# Patient Record
Sex: Female | Born: 1990 | Race: Black or African American | Hispanic: No | Marital: Single | Smoking: Never smoker
Health system: Southern US, Community
[De-identification: ages and names within clinical notes are randomized; demographics above are authoritative.]

## PROBLEM LIST (undated history)

## (undated) ENCOUNTER — Inpatient Hospital Stay (HOSPITAL_COMMUNITY): Payer: Self-pay

## (undated) DIAGNOSIS — F419 Anxiety disorder, unspecified: Secondary | ICD-10-CM

## (undated) DIAGNOSIS — F32A Depression, unspecified: Secondary | ICD-10-CM

## (undated) DIAGNOSIS — F329 Major depressive disorder, single episode, unspecified: Secondary | ICD-10-CM

## (undated) HISTORY — DX: Depression, unspecified: F32.A

## (undated) HISTORY — DX: Anxiety disorder, unspecified: F41.9

## (undated) HISTORY — DX: Major depressive disorder, single episode, unspecified: F32.9

---

## 2012-04-15 ENCOUNTER — Encounter (HOSPITAL_COMMUNITY): Payer: Self-pay | Admitting: Emergency Medicine

## 2012-04-15 ENCOUNTER — Emergency Department (INDEPENDENT_AMBULATORY_CARE_PROVIDER_SITE_OTHER)
Admission: EM | Admit: 2012-04-15 | Discharge: 2012-04-15 | Disposition: A | Discharge status: Home / Self Care | Payer: BLUE CROSS/BLUE SHIELD | Source: Home / Self Care | Attending: Emergency Medicine | Admitting: Emergency Medicine

## 2012-04-15 DIAGNOSIS — L0231 Cutaneous abscess of buttock: Secondary | ICD-10-CM

## 2012-04-15 DIAGNOSIS — R52 Pain, unspecified: Secondary | ICD-10-CM

## 2012-04-15 MED ORDER — IBUPROFEN 600 MG PO TABS: 600.0000 mg | ORAL_TABLET | Freq: Four times a day (QID) | ORAL | Status: DC | PRN

## 2012-04-15 MED ORDER — SULFAMETHOXAZOLE-TRIMETHOPRIM 800-160 MG PO TABS: 1.0000 | ORAL_TABLET | Freq: Two times a day (BID) | ORAL | Status: DC

## 2012-04-15 MED ORDER — HYDROCODONE-ACETAMINOPHEN 5-500 MG PO TABS: 1.0000 | ORAL_TABLET | Freq: Four times a day (QID) | ORAL | Status: DC | PRN

## 2012-04-15 MED ORDER — HYDROCODONE-ACETAMINOPHEN 5-325 MG PO TABS
1.0000 | ORAL_TABLET | Freq: Once | ORAL | Status: AC
  Administered 2012-04-15: 1 via ORAL

## 2012-04-15 MED ORDER — HYDROCODONE-ACETAMINOPHEN 5-325 MG PO TABS
ORAL_TABLET | ORAL | Status: AC
  Filled 2012-04-15: qty 1

## 2012-04-15 MED ORDER — IBUPROFEN 800 MG PO TABS
ORAL_TABLET | ORAL | Status: AC
  Filled 2012-04-15: qty 1

## 2012-04-15 MED ORDER — IBUPROFEN 800 MG PO TABS
800.0000 mg | ORAL_TABLET | Freq: Once | ORAL | Status: AC
  Administered 2012-04-15: 800 mg via ORAL

## 2012-04-15 NOTE — ED Provider Notes (Signed)
History     CSN: [id number]  Arrival date & time 04/15/12  1405   First MD Initiated Contact with Patient 04/15/12 1408      Chief Complaint  Patient presents with  . Insect Bite    (Consider location/radiation/quality/duration/timing/severity/associated sxs/prior treatment) The history is provided by the patient.  This patient presents for evaluation of a cutaneous abscess.  The lesion is located on the right buttocks.  Onset was 1 week ago.  Symptoms have worsening.  Abscess has associated symptoms of tenderness.  The patient does not have previous history of cutaneous abscesses.  There is no known previous history of MRSA.   They do not have a history of diabetes.  History reviewed. No pertinent past medical history.  History reviewed. No pertinent past surgical history.  History reviewed. No pertinent family history.  History  Substance Use Topics  . Smoking status: Never Smoker   . Smokeless tobacco: Not on file  . Alcohol Use: No    OB History    Grav Para Term Preterm Abortions TAB SAB Ect Mult Living                  Review of Systems  Constitutional: Negative.   Respiratory: Negative.   Cardiovascular: Negative.   Musculoskeletal: Negative.   Skin: Positive for wound.    Allergies  Review of patient's allergies indicates no known allergies.  Home Medications   Current Outpatient Rx  Name Route Sig Dispense Refill  . HYDROCODONE-ACETAMINOPHEN 5-500 MG PO TABS Oral Take 1 tablet by mouth every 6 (six) hours as needed for pain. 8 tablet 0  . IBUPROFEN 600 MG PO TABS Oral Take 1 tablet (600 mg total) by mouth every 6 (six) hours as needed for pain. 30 tablet 0  . SULFAMETHOXAZOLE-TRIMETHOPRIM 800-160 MG PO TABS Oral Take 1 tablet by mouth every 12 (twelve) hours. 20 tablet 0    BP 110/59  Pulse 85  Temp 98.3 F (36.8 C) (Oral)  Resp 16  SpO2 100%  LMP 03/25/2012  Physical Exam  Nursing note and vitals reviewed. Constitutional: She is  oriented to person, place, and time. Vital signs are normal. She appears well-developed and well-nourished. She is active and cooperative.  HENT:  Head: Normocephalic.  Eyes: Conjunctivae normal are normal. Pupils are equal, round, and reactive to light. No scleral icterus.  Neck: Trachea normal. Neck supple.  Cardiovascular: Normal rate and regular rhythm.   Pulmonary/Chest: Effort normal and breath sounds normal.  Musculoskeletal: Normal range of motion.  Neurological: She is alert and oriented to person, place, and time. She has normal strength. No cranial nerve deficit or sensory deficit. GCS eye subscore is 4. GCS verbal subscore is 5. GCS motor subscore is 6.  Skin: Skin is warm, dry and intact.     Psychiatric: She has a normal mood and affect. Her speech is normal and behavior is normal. Judgment and thought content normal. Cognition and memory are normal.    ED Course  INCISION AND DRAINAGE Date/Time: 04/15/2012 4:04 PM Performed by: Nigel Mormon Tionna Gigante L Authorized by: Johnsie Kindred Consent: Verbal consent obtained. Risks and benefits: risks, benefits and alternatives were discussed Consent given by: patient Patient understanding: patient states understanding of the procedure being performed Patient consent: the patient's understanding of the procedure matches consent given Procedure consent: procedure consent matches procedure scheduled Site marked: the operative site was marked Patient identity confirmed: verbally with patient and arm band Time out: Immediately prior to procedure a "time  out" was called to verify the correct patient, procedure, equipment, support staff and site/side marked as required. Type: abscess Body area: anogenital Location details: gluteal cleft Anesthesia: local infiltration Local anesthetic: lidocaine 2% with epinephrine Anesthetic total: 4 ml Patient sedated: no Scalpel size: 11 Needle gauge: 27. Incision type: crosscut. Complexity:  simple Drainage: purulent, serosanguinous and serous Drainage amount: moderate Wound treatment: wound left open Packing material: 1/2 in iodoform gauze Patient tolerance: Patient tolerated the procedure well with no immediate complications.   (including critical care time)   Labs Reviewed  CULTURE, ROUTINE-ABSCESS   No results found.   1. Abscess of gluteal cleft   2. Pain       MDM  Medications as prescribed, rtc in 4 days for wound recheck.        Johnsie Kindred, NP 04/16/12 1212

## 2012-04-15 NOTE — ED Notes (Signed)
?   Spider bite to right buttock. Noticed 4 days ago, pt states that its is a black and redness around bite.   Pt states it started out as a small bump but is gradually getting bigger and is painful.

## 2012-04-18 LAB — CULTURE, ROUTINE-ABSCESS

## 2012-04-18 NOTE — ED Notes (Signed)
Abscess culture R buttocks:  Mod. Staph. Aureus.  Pt. adequately treated with Septra DS. Vassie Moselle 04/18/2012

## 2012-04-21 NOTE — ED Provider Notes (Signed)
Medical screening examination/treatment/procedure(s) were performed by non-physician practitioner and as supervising physician I was immediately available for consultation/collaboration.  Pauline Pegues, M.D.   Felipe Cabell C Derian Pfost, MD 04/21/12 1254 

## 2015-04-25 ENCOUNTER — Emergency Department (HOSPITAL_COMMUNITY)
Admission: EM | Admit: 2015-04-25 | Discharge: 2015-04-25 | Discharge status: Against Medical Advice | Payer: BLUE CROSS/BLUE SHIELD
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
## 2015-04-25 NOTE — ED Notes (Signed)
Pt name called x 3 for triage with no response

## 2015-06-29 HISTORY — PX: DILATION AND CURETTAGE OF UTERUS: SHX78

## 2017-01-05 LAB — OB RESULTS CONSOLE HGB/HCT, BLOOD
HCT: 41
Hemoglobin: 13.8

## 2017-01-05 LAB — OB RESULTS CONSOLE HEPATITIS B SURFACE ANTIGEN: HEP B S AG: NEGATIVE

## 2017-01-05 LAB — OB RESULTS CONSOLE PLATELET COUNT: Platelets: 257

## 2017-01-05 LAB — OB RESULTS CONSOLE ABO/RH
RH TYPE: NEGATIVE
RH TYPE: POSITIVE

## 2017-01-05 LAB — HIV ANTIBODY (ROUTINE TESTING W REFLEX): HIV SCREEN 4TH GENERATION: NEGATIVE

## 2017-01-05 LAB — OB RESULTS CONSOLE ANTIBODY SCREEN: ANTIBODY SCREEN: NEGATIVE

## 2017-01-05 LAB — OB RESULTS CONSOLE RPR: RPR: NONREACTIVE

## 2017-01-05 LAB — OB RESULTS CONSOLE RUBELLA ANTIBODY, IGM: RUBELLA: IMMUNE

## 2017-02-07 ENCOUNTER — Other Ambulatory Visit: Payer: Self-pay | Admitting: Obstetrics and Gynecology

## 2017-02-07 ENCOUNTER — Other Ambulatory Visit (HOSPITAL_COMMUNITY)
Admission: RE | Admit: 2017-02-07 | Discharge: 2017-02-07 | Disposition: A | Discharge status: Home / Self Care | Payer: BLUE CROSS/BLUE SHIELD | Source: Ambulatory Visit | Attending: Obstetrics and Gynecology | Admitting: Obstetrics and Gynecology

## 2017-02-07 DIAGNOSIS — Z124 Encounter for screening for malignant neoplasm of cervix: Secondary | ICD-10-CM | POA: Diagnosis present

## 2017-02-07 LAB — OB RESULTS CONSOLE GC/CHLAMYDIA
Chlamydia: NEGATIVE
Gonorrhea: NEGATIVE

## 2017-02-07 LAB — CYTOLOGY - PAP: PAP SMEAR: NEGATIVE

## 2017-02-09 LAB — CYTOLOGY - PAP
CHLAMYDIA, DNA PROBE: NEGATIVE
Diagnosis: NEGATIVE
NEISSERIA GONORRHEA: NEGATIVE

## 2017-04-08 ENCOUNTER — Encounter: Payer: Self-pay | Admitting: *Deleted

## 2017-04-11 ENCOUNTER — Encounter: Payer: BLUE CROSS/BLUE SHIELD | Admitting: Advanced Practice Midwife

## 2017-05-04 ENCOUNTER — Encounter: Payer: Self-pay | Admitting: Obstetrics and Gynecology

## 2017-05-04 ENCOUNTER — Ambulatory Visit (INDEPENDENT_AMBULATORY_CARE_PROVIDER_SITE_OTHER): Payer: BLUE CROSS/BLUE SHIELD | Admitting: Obstetrics and Gynecology

## 2017-05-04 VITALS — BP 130/62 | HR 109 | Ht 61.0 in | Wt 164.3 lb

## 2017-05-04 DIAGNOSIS — Z23 Encounter for immunization: Secondary | ICD-10-CM

## 2017-05-04 DIAGNOSIS — Z3490 Encounter for supervision of normal pregnancy, unspecified, unspecified trimester: Secondary | ICD-10-CM

## 2017-05-04 DIAGNOSIS — Z3480 Encounter for supervision of other normal pregnancy, unspecified trimester: Secondary | ICD-10-CM | POA: Insufficient documentation

## 2017-05-04 DIAGNOSIS — Z3492 Encounter for supervision of normal pregnancy, unspecified, second trimester: Secondary | ICD-10-CM

## 2017-05-04 NOTE — Progress Notes (Signed)
New ob packet given  Flu vaccine given  Anatomy US scheduled for November 12th @ 1415.  Pt notified.   Panorama shipped via [employer].  Confirmation # [id number]

## 2017-05-04 NOTE — Progress Notes (Signed)
   PRENATAL VISIT NOTE - TRANSFER from [facility]  Subjective:  Karla Davies is a 26 y.o. G2P0010 at 1438w3d being seen today for ongoing prenatal care. [facility]. She is currently monitored for the following issues for this low-risk pregnancy and does not have a problem list on file.  Patient reports active fetal movement and slight increase in usual discharge, denies itching/burning.  Contractions: Not present. Vag. Bleeding: None.  Movement: Present. Denies leaking of fluid.   The following portions of the patient's history were reviewed and updated as appropriate: allergies, current medications, past family history, past medical history, past social history, past surgical history and problem list. Problem list updated.  No current outpatient medications on file.   Objective:   Vitals:   05/04/17 0825 05/04/17 0827  BP: 130/62   Pulse: (!) 109   Weight: 164 lb 4.8 oz (74.5 kg)   Height:  5\' 1"  (1.549 m)    Fetal Status: Fetal Heart Rate (bpm): 151   Movement: Present     General:  Alert, oriented and cooperative. Patient is in no acute distress.  Skin: Skin is warm and dry. No rash noted.   Cardiovascular: Normal heart rate noted  Respiratory: Normal respiratory effort, no problems with respiration noted  Abdomen: Soft, gravid, appropriate for gestational age.  Pain/Pressure: Present     Pelvic: Cervical exam deferred        Extremities: Normal range of motion.  Edema: Trace  Mental Status:  Normal mood and affect. Normal behavior. Normal judgment and thought content.   Assessment and Plan:  Pregnancy: G2P0010 at 8738w3d  1. Encounter for supervision of low-risk pregnancy, antepartum Flu shot today Patient desires NIPS - Genetic Screening - Cystic Fibrosis Mutation 97 - SMN1 COPY NUMBER ANALYSIS (SMA Carrier Screen) - US MFM OB COMP + 14 WK; Future  Preterm labor symptoms and general obstetric precautions including but not limited to  vaginal bleeding, contractions, leaking of fluid and fetal movement were reviewed in detail with the patient. Please refer to After Visit Summary for other counseling recommendations.  Return in about 4 weeks (around 06/01/2017) for 2 hr GTT, OB visit.   Conan BowensKelly M Davis, MD

## 2017-05-09 ENCOUNTER — Ambulatory Visit (HOSPITAL_COMMUNITY)
Admission: RE | Admit: 2017-05-09 | Discharge: 2017-05-09 | Disposition: A | Discharge status: Home / Self Care | Payer: BLUE CROSS/BLUE SHIELD | Source: Ambulatory Visit | Attending: Obstetrics and Gynecology | Admitting: Obstetrics and Gynecology

## 2017-05-09 DIAGNOSIS — Z3490 Encounter for supervision of normal pregnancy, unspecified, unspecified trimester: Secondary | ICD-10-CM | POA: Insufficient documentation

## 2017-05-09 DIAGNOSIS — Z3A24 24 weeks gestation of pregnancy: Secondary | ICD-10-CM | POA: Diagnosis not present

## 2017-05-09 IMAGING — US US MFM OB COMP +14 WKS
1 series · 14 of 28 positions shown · non-contrast
Comparison: none

[Series 1: us mfm ob comp +14 wks · 129 acquisitions, 14 frames shown]
[im 5/129]
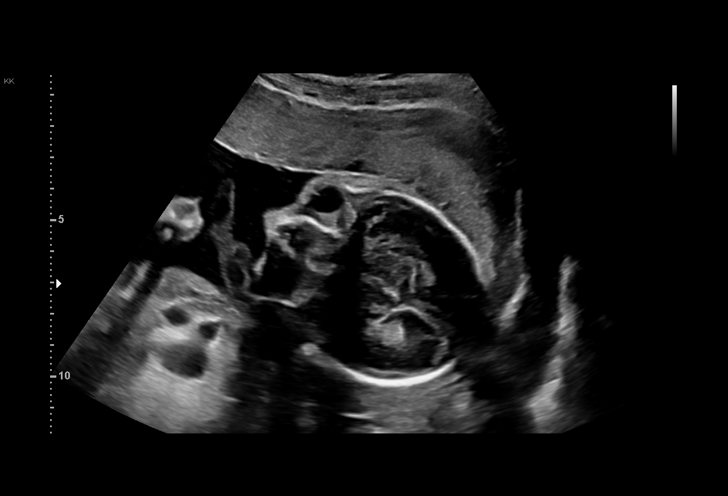
[im 15/129]
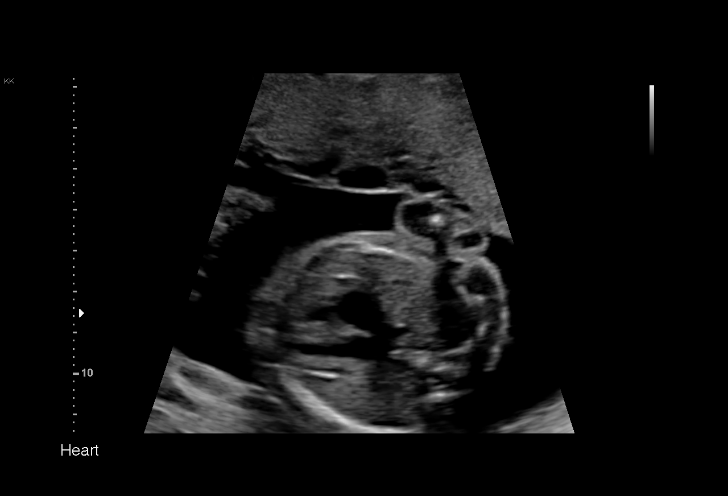
[im 24/129]
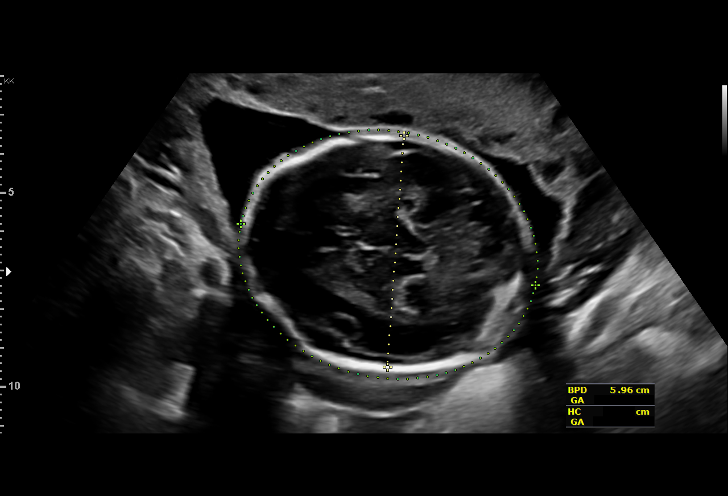
[im 34/129]
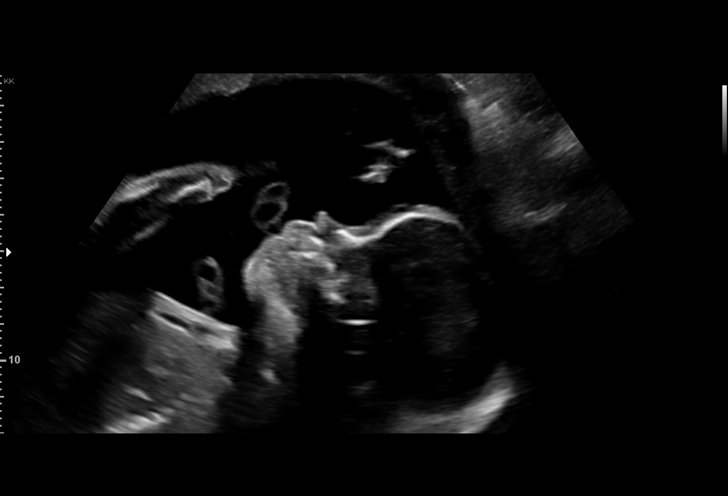
[im 43/129]
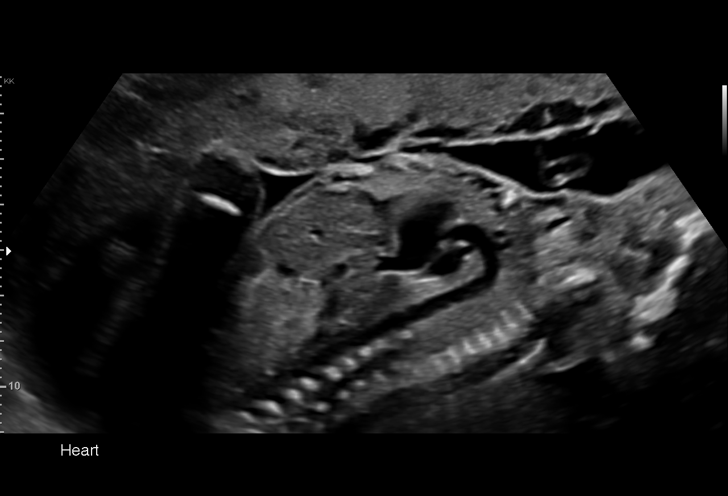
[im 53/129]
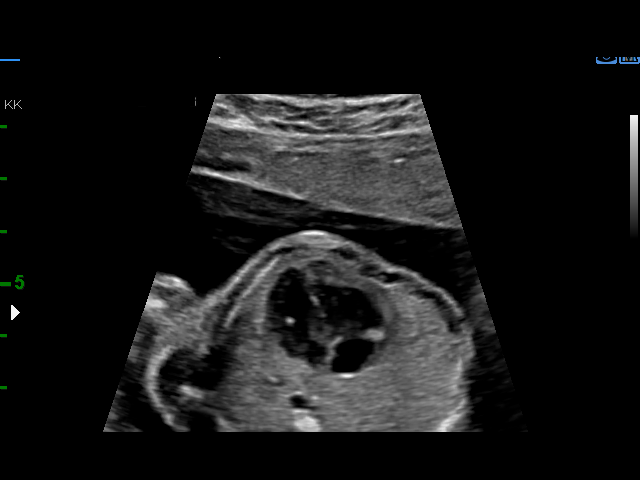
[im 62/129]
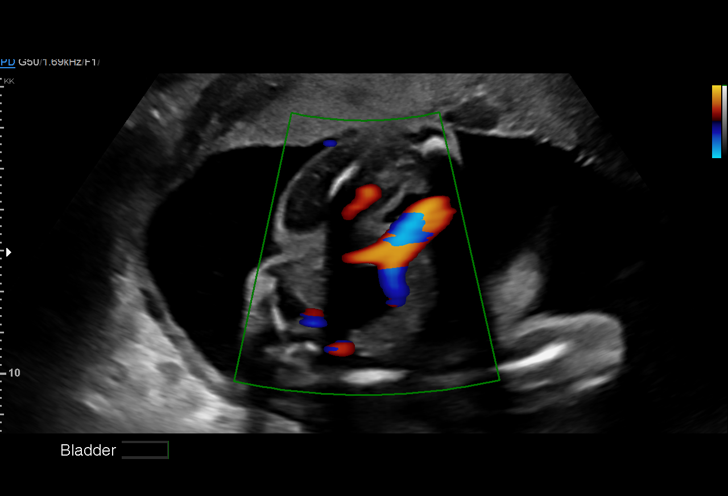
[im 72/129]
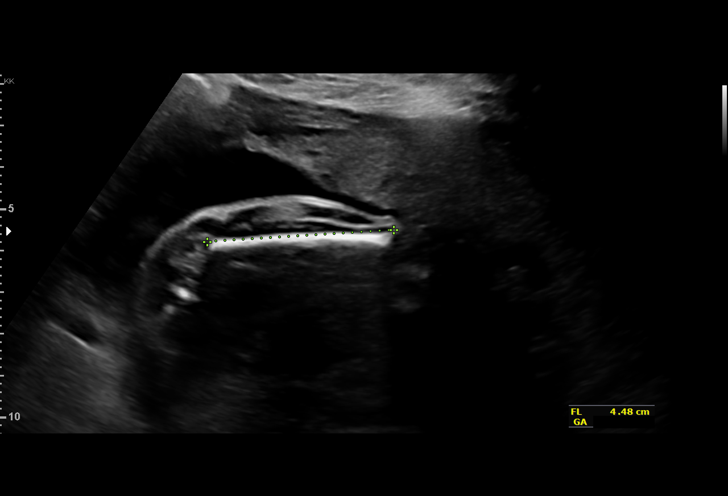
[im 81/129]
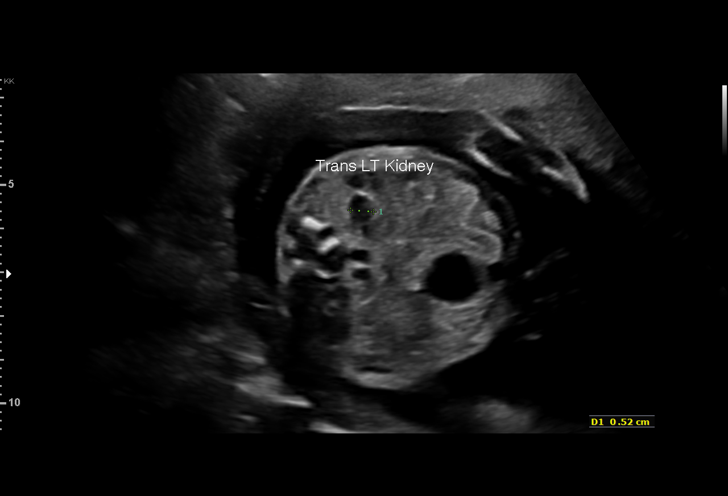
[im 91/129]
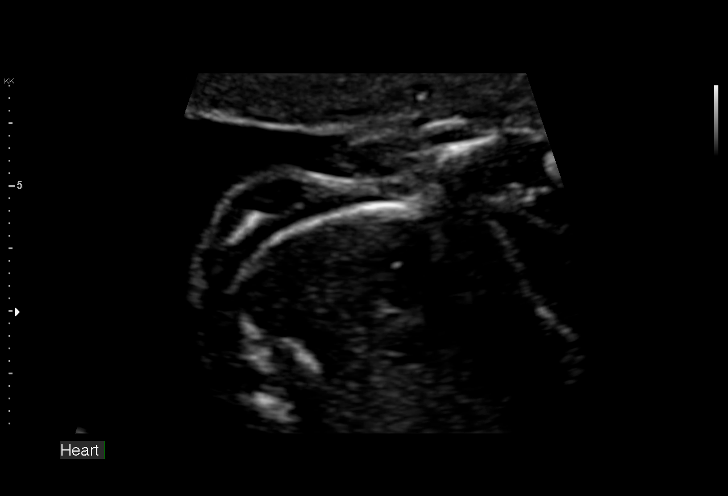
[im 100/129]
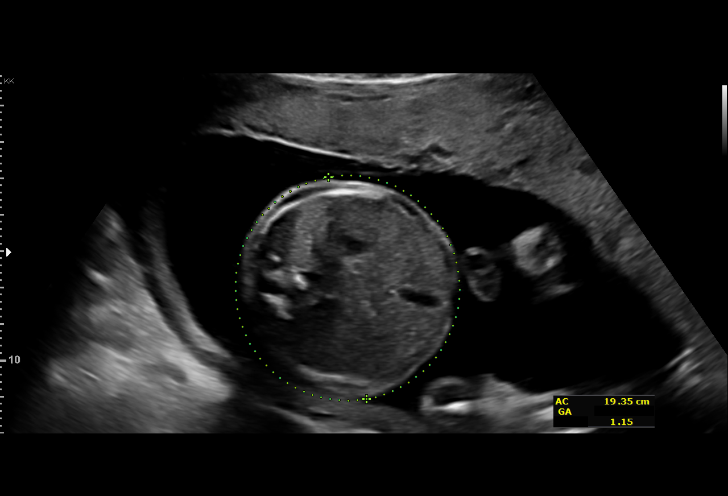
[im 110/129]
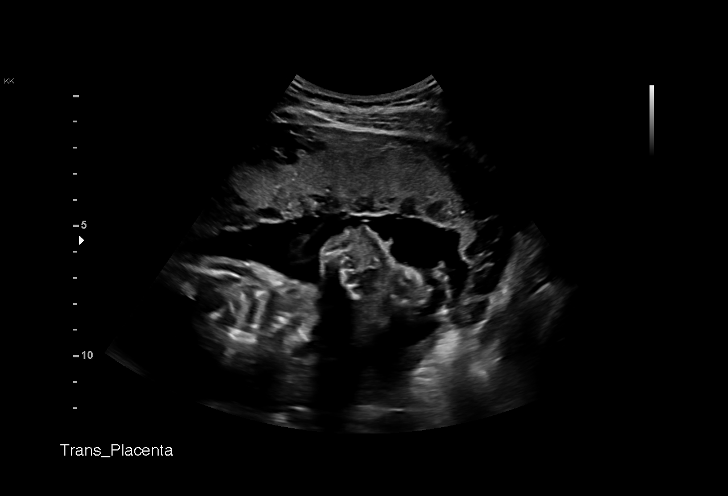
[im 119/129]
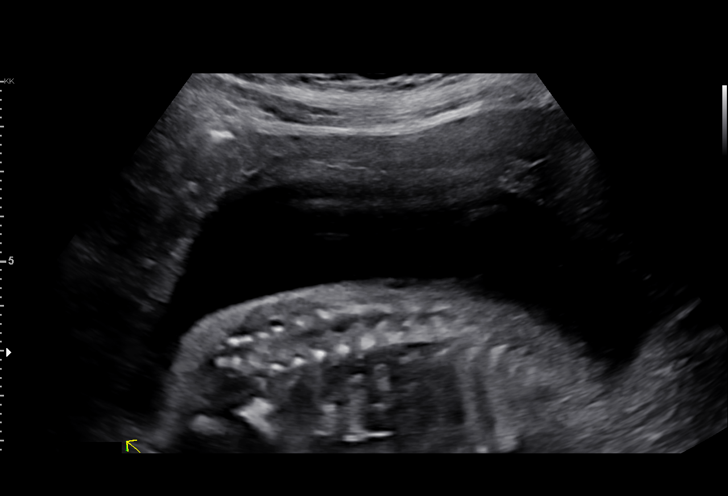
[im 129/129]
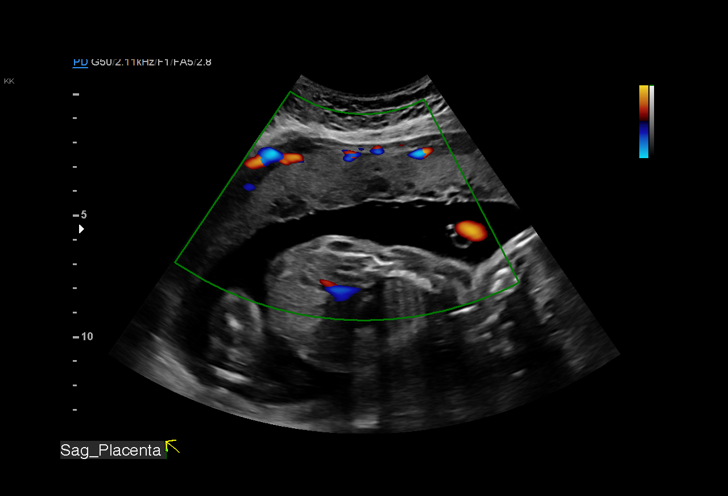

[14 of 28 positions shown; findings below may reference images not displayed]

1  FRISCH              [PHONE_NUMBER]       [PHONE_NUMBER]     [PHONE_NUMBER]
Indications

24 weeks gestation of pregnancy
Encounter for antenatal screening for          [5Z]
malformations
OB History

Blood Type:            Height:  5'1"   Weight (lb):  164       BMI:
Gravidity:    2
TOP:          1        Living:  0
Fetal Evaluation

Num Of Fetuses:     1
Fetal Heart         143
Rate(bpm):
Cardiac Activity:   Observed
Presentation:       Cephalic
Placenta:           Anterior, above cervical os
P. Cord Insertion:  Visualized

Amniotic Fluid
AFI FV:      Subjectively within normal limits

Largest Pocket(cm)
5.6
Biometry

BPD:      59.6  mm     G. Age:  24w 2d         50  %    CI:         72.8   %    70 - 86
FL/HC:      19.8   %    18.7 -
HC:      222.1  mm     G. Age:  24w 1d         36  %    HC/AC:      1.15        1.05 -
AC:      192.9  mm     G. Age:  24w 0d         37  %    FL/BPD:     73.8   %    71 - 87
FL:         44  mm     G. Age:  24w 4d         48  %    FL/AC:      22.8   %    20 - 24

Est. FW:     673  gm      1 lb 8 oz     53  %
Gestational Age

LMP:           22w 6d        Date:  [DATE]                 EDD:   [DATE]
U/S Today:     24w 2d                                        EDD:   [DATE]
Best:          24w 1d     Det. By:  Previous Ultrasound      EDD:   [DATE]
([DATE])
Anatomy

Cranium:               Appears normal         Aortic Arch:            Appears normal
Cavum:                 Appears normal         Ductal Arch:            Appears normal
Ventricles:            Appears normal         Diaphragm:              Appears normal
Choroid Plexus:        Appears normal         Stomach:                Appears normal, left
sided
Cerebellum:            Appears normal         Abdomen:                Appears normal
Posterior Fossa:       Appears normal         Abdominal Wall:         Appears nml (cord
insert, abd wall)
Nuchal Fold:           Not applicable (>20    Cord Vessels:           Appears normal (3
wks GA)                                        vessel cord)
Face:                  Appears normal         Kidneys:                Appear normal
(orbits and profile)
Lips:                  Appears normal         Bladder:                Appears normal
Thoracic:              Appears normal         Spine:                  Appears normal
Heart:                 Appears normal         Upper Extremities:      Appears normal
(4CH, axis, and
situs)
RVOT:                  Appears normal         Lower Extremities:      Appears normal
LVOT:                  Appears normal

Other:  Male gender. Heels and 5th digit visualized. Technically difficult due
to fetal position.
Cervix Uterus Adnexa

Cervix
Length:            3.1  cm.
Normal appearance by transabdominal scan.
Impression

SIUP at 24+1 weeks
Normal detailed fetal anatomy
Normal amniotic fluid volume
Measurements consistent with prior US
Recommendations

Follow-up as clinically indicated

## 2017-05-13 LAB — SMN1 COPY NUMBER ANALYSIS (SMA CARRIER SCREENING)

## 2017-05-13 LAB — CYSTIC FIBROSIS MUTATION 97: GENE DIS ANAL CARRIER INTERP BLD/T-IMP: NOT DETECTED

## 2017-05-26 ENCOUNTER — Encounter (HOSPITAL_COMMUNITY): Payer: Self-pay | Admitting: *Deleted

## 2017-05-26 ENCOUNTER — Inpatient Hospital Stay (HOSPITAL_COMMUNITY)
Admission: AD | Admit: 2017-05-26 | Discharge: 2017-05-26 | Disposition: A | Discharge status: Home / Self Care | Payer: BLUE CROSS/BLUE SHIELD | Source: Ambulatory Visit | Attending: Obstetrics & Gynecology | Admitting: Obstetrics & Gynecology

## 2017-05-26 DIAGNOSIS — O26892 Other specified pregnancy related conditions, second trimester: Secondary | ICD-10-CM | POA: Insufficient documentation

## 2017-05-26 DIAGNOSIS — Z3480 Encounter for supervision of other normal pregnancy, unspecified trimester: Secondary | ICD-10-CM

## 2017-05-26 DIAGNOSIS — N949 Unspecified condition associated with female genital organs and menstrual cycle: Secondary | ICD-10-CM

## 2017-05-26 DIAGNOSIS — Z3A26 26 weeks gestation of pregnancy: Secondary | ICD-10-CM

## 2017-05-26 DIAGNOSIS — O26899 Other specified pregnancy related conditions, unspecified trimester: Secondary | ICD-10-CM

## 2017-05-26 DIAGNOSIS — O9989 Other specified diseases and conditions complicating pregnancy, childbirth and the puerperium: Secondary | ICD-10-CM | POA: Diagnosis not present

## 2017-05-26 DIAGNOSIS — N858 Other specified noninflammatory disorders of uterus: Secondary | ICD-10-CM

## 2017-05-26 DIAGNOSIS — R109 Unspecified abdominal pain: Secondary | ICD-10-CM

## 2017-05-26 DIAGNOSIS — R102 Pelvic and perineal pain: Secondary | ICD-10-CM | POA: Insufficient documentation

## 2017-05-26 DIAGNOSIS — N859 Noninflammatory disorder of uterus, unspecified: Secondary | ICD-10-CM | POA: Diagnosis not present

## 2017-05-26 DIAGNOSIS — O4692 Antepartum hemorrhage, unspecified, second trimester: Secondary | ICD-10-CM | POA: Insufficient documentation

## 2017-05-26 LAB — CBC
HEMATOCRIT: 34.9 % — AB (ref 36.0–46.0)
HEMOGLOBIN: 11.7 g/dL — AB (ref 12.0–15.0)
MCH: 29.3 pg (ref 26.0–34.0)
MCHC: 33.5 g/dL (ref 30.0–36.0)
MCV: 87.5 fL (ref 78.0–100.0)
Platelets: 235 10*3/uL (ref 150–400)
RBC: 3.99 MIL/uL (ref 3.87–5.11)
RDW: 13.6 % (ref 11.5–15.5)
WBC: 15.3 10*3/uL — AB (ref 4.0–10.5)

## 2017-05-26 LAB — URINALYSIS, ROUTINE W REFLEX MICROSCOPIC
BILIRUBIN URINE: NEGATIVE
GLUCOSE, UA: NEGATIVE mg/dL
HGB URINE DIPSTICK: NEGATIVE
KETONES UR: NEGATIVE mg/dL
Leukocytes, UA: NEGATIVE
NITRITE: NEGATIVE
PH: 6 (ref 5.0–8.0)
Protein, ur: NEGATIVE mg/dL
SPECIFIC GRAVITY, URINE: 1.017 (ref 1.005–1.030)

## 2017-05-26 LAB — WET PREP, GENITAL
SPERM: NONE SEEN
Trich, Wet Prep: NONE SEEN
Yeast Wet Prep HPF POC: NONE SEEN

## 2017-05-26 MED ORDER — LACTATED RINGERS IV BOLUS (SEPSIS): 1000.0000 mL | Freq: Once | INTRAVENOUS | Status: DC

## 2017-05-26 NOTE — Discharge Instructions (Signed)

## 2017-05-26 NOTE — MAU Note (Signed)
PT  SAYS SHE HAD VAG BLEEDING  AT 5 PM - IN HER UNDERWEAR- NO MORE SINCE THEN  BUT  HAS D[facility] .  [facility]  LAST SEX-  1 WEEK   AGO.     FEELS CRAMPS - STARTED AT  5 PM- WORSE  NOW.

## 2017-05-26 NOTE — MAU Provider Note (Signed)
History     CSN: [id number]  Arrival date and time: 05/26/17 2051   First Provider Initiated Contact with Patient 05/26/17 2201      Chief Complaint  Patient presents with  . Pelvic Pain   Pelvic Pain  The patient's primary symptoms include pelvic pain and vaginal bleeding. This is a new problem. The current episode started today. The problem occurs constantly. The problem has been resolved. Pain severity now: 0/10 at this time.  The problem affects both sides. She is pregnant. The vaginal bleeding is spotting (bright red spotting x1, but none since. ). She has not been passing clots. She has not been passing tissue. Nothing aggravates the symptoms. She has tried nothing for the symptoms. Sexual activity: No intercourse in the last 24 hours.    Past Medical History:  Diagnosis Date  . Anxiety   . Depression     Past Surgical History:  Procedure Laterality Date  . DILATION AND CURETTAGE OF UTERUS  2017    Family History  Problem Relation Age of Onset  . Heart disease Father     Social History   Tobacco Use  . Smoking status: Never Smoker  . Smokeless tobacco: Never Used  Substance Use Topics  . Alcohol use: No  . Drug use: No    Allergies: No Known Allergies  No medications prior to admission.    Review of Systems  Genitourinary: Positive for pelvic pain.   Physical Exam   Blood pressure 118/77, pulse (!) 123, temperature 98.3 F (36.8 C), temperature source Oral, resp. rate 20, height 5\' 1"  (1.549 m), weight 166 lb 4 oz (75.4 kg), last menstrual period 11/30/2016.  Physical Exam  Nursing note and vitals reviewed. Constitutional: She is oriented to person, place, and time. She appears well-developed. No distress.  HENT:  Head: Normocephalic.  Cardiovascular: Normal rate.  Respiratory: Effort normal.  GI: Soft. There is no tenderness. There is no rebound.  Genitourinary:  Genitourinary Comments:  External: no lesion Vagina: small amount of white  discharge, no blood seen  Cervix: pink, smooth, closed/thick  Uterus: AGA  Neurological: She is alert and oriented to person, place, and time.  Skin: Skin is warm and dry.  Psychiatric: She has a normal mood and affect.  FHT: 145, moderate with 15x15 accels, no decels Toco: some UI  Results for orders placed or performed during the hospital encounter of 05/26/17 (from the past 24 hour(s))  Urinalysis, Routine w reflex microscopic     Status: Abnormal   Collection Time: 05/26/17  9:08 PM  Result Value Ref Range   Color, Urine YELLOW YELLOW   APPearance HAZY (A) CLEAR   Specific Gravity, Urine 1.017 1.005 - 1.030   pH 6.0 5.0 - 8.0   Glucose, UA NEGATIVE NEGATIVE mg/dL   Hgb urine dipstick NEGATIVE NEGATIVE   Bilirubin Urine NEGATIVE NEGATIVE   Ketones, ur NEGATIVE NEGATIVE mg/dL   Protein, ur NEGATIVE NEGATIVE mg/dL   Nitrite NEGATIVE NEGATIVE   Leukocytes, UA NEGATIVE NEGATIVE  CBC     Status: Abnormal   Collection Time: 05/26/17 10:14 PM  Result Value Ref Range   WBC 15.3 (H) 4.0 - 10.5 K/uL   RBC 3.99 3.87 - 5.11 MIL/uL   Hemoglobin 11.7 (L) 12.0 - 15.0 g/dL   HCT 40.934.9 (L) 81.136.0 - 91.446.0 %   MCV 87.5 78.0 - 100.0 fL   MCH 29.3 26.0 - 34.0 pg   MCHC 33.5 30.0 - 36.0 g/dL   RDW 78.213.6 95.611.5 -  15.5 %   Platelets 235 150 - 400 K/uL  Wet prep, genital     Status: Abnormal   Collection Time: 05/26/17 11:17 PM  Result Value Ref Range   Yeast Wet Prep HPF POC NONE SEEN NONE SEEN   Trich, Wet Prep NONE SEEN NONE SEEN   Clue Cells Wet Prep HPF POC PRESENT (A) NONE SEEN   WBC, Wet Prep HPF POC MODERATE (A) NONE SEEN   Sperm NONE SEEN     MAU Course  Procedures  MDM Spoke with lab. Sam states that the records from [facility] show patient is Rh +, and there is a faxed record from the A[facility]/RH done on 7/11 and it was also Rh +. He states that the Rh negative status was entered incorrectly into epic.   LR bolus complete. UI has stopped.   Assessment and Plan   1. Uterine irritability    2. Supervision of other normal pregnancy, antepartum   3. [redacted] weeks gestation of pregnancy   4. Pelvic pain in pregnancy   5. Round ligament pain    DC home Comfort measures reviewed  3rd Trimester precautions  PTL precautions  Fetal kick counts RX: none  Return to MAU as needed FU with OB as planned  Follow-up Information    [facility]   Specialty:  Obstetrics and Gynecology Contact information: [address] [place] [id number] [phone]           Karla Davies 05/26/2017, 11:29 PM

## 2017-05-27 LAB — GC/CHLAMYDIA PROBE AMP (~~LOC~~) NOT AT ARMC
Chlamydia: NEGATIVE
Neisseria Gonorrhea: NEGATIVE

## 2017-05-30 ENCOUNTER — Encounter: Payer: Self-pay | Admitting: General Practice

## 2017-05-30 ENCOUNTER — Ambulatory Visit (INDEPENDENT_AMBULATORY_CARE_PROVIDER_SITE_OTHER): Payer: BLUE CROSS/BLUE SHIELD | Admitting: Advanced Practice Midwife

## 2017-05-30 ENCOUNTER — Ambulatory Visit: Payer: Self-pay

## 2017-05-30 ENCOUNTER — Ambulatory Visit (INDEPENDENT_AMBULATORY_CARE_PROVIDER_SITE_OTHER): Payer: BLUE CROSS/BLUE SHIELD | Admitting: Clinical

## 2017-05-30 VITALS — BP 112/67 | HR 101 | Wt 164.3 lb

## 2017-05-30 DIAGNOSIS — Z3480 Encounter for supervision of other normal pregnancy, unspecified trimester: Secondary | ICD-10-CM

## 2017-05-30 DIAGNOSIS — F4323 Adjustment disorder with mixed anxiety and depressed mood: Secondary | ICD-10-CM

## 2017-05-30 DIAGNOSIS — L309 Dermatitis, unspecified: Secondary | ICD-10-CM | POA: Insufficient documentation

## 2017-05-30 DIAGNOSIS — Z23 Encounter for immunization: Secondary | ICD-10-CM | POA: Diagnosis not present

## 2017-05-30 DIAGNOSIS — Z3482 Encounter for supervision of other normal pregnancy, second trimester: Secondary | ICD-10-CM

## 2017-05-30 MED ORDER — TETANUS-DIPHTH-ACELL PERTUSSIS 5-2.5-18.5 LF-MCG/0.5 IM SUSP
0.5000 mL | Freq: Once | INTRAMUSCULAR | Status: AC
  Administered 2017-05-30: 0.5 mL via INTRAMUSCULAR

## 2017-05-30 MED ORDER — HYDROCORTISONE 2.5 % EX CREA: TOPICAL_CREAM | Freq: Two times a day (BID) | CUTANEOUS | 0 refills | Status: DC

## 2017-05-30 NOTE — Patient Instructions (Signed)
AREA PEDIATRIC/FAMILY [place] [address], [address] [place], [place]  [address] Phone - [phone]   Fax - [phone]  [facility] [place] [place] [address] [place], [place] [address] Phone - [phone]   Fax - [place] [address] [place], [place]  [address] Phone - [phone]   Fax - [phone]  [place]. [address] [place] [address] [place], [place]  [address] Phone - [phone]   Fax - [phone]  [place] [address] [place], [place]  [address] Phone - [phone]   Fax - [phone]  [facility] [address] [place], [place]  [address] Phone - [phone]   Fax - [place] [address] [place], [place]  [address] Phone - [phone]   Fax - [phone]  [place] [address] [place], [place] [address] [place], [place]  [address] Phone - [phone]   Fax - [place] [address] [place], [place]  [address] Phone - [phone]   Fax - [phone] [facility] [place]. [address] [place], [place]  [address] Phone - [phone]   Fax - [phone]  [place] [address] [place], [place]  [address] Phone - [phone]   Fax - [phone]  [facility] [place], [place], [place]  [address] Phone - [phone]   Fax - [place] [address] [place], [place]  [address] Phone - [phone]   Fax - [phone]  [facility] [address] [place], [place]  [address] Phone - [place], [place]  [address] Phone - [phone]   Fax - [place] [address] [place], [place]  [address] Phone - [phone]   Fax - [phone]  [place] [address] [place], [place]  [address] Phone - [phone]   Fax - [place]. [place], [place]  [address] Phone - [phone]   Fax - [place], [place], [place]  [address] Phone - [phone]   Fax - [place] [address], [place], [place]  [address] Phone - [phone]   Fax - [phone]  [place] [address] [place], [place]  [address] Phone - [phone]   Fax - [place] [address] [place], [place]  [address] Phone - [phone]   Fax - [phone]  [place] [address] [place], [place]  [address] Phone - [phone]   Fax - [phone] [place] [address] [place], [place]  [address] Phone - [phone]   Fax - [place] [address] [place], [place]  [address] Phone - [phone]   Fax - [place] [address] [place], [place]  [address] Phone - [phone]   Fax - [phone]  [place] [address] [place] [address] Phone [phone]  Fax [phone]  Places to have your son circumcised:    [facility] [phone] while you  are in hospital  [facility] [phone] $244 by 4 wks  [facility] [phone] $175 by 2 wks  [place] [phone] $250 by 7 days [facility] [phone] $150 by 4 wks  These prices sometimes change but are roughly what you can expect to pay. Please  call and confirm pricing.   Circumcision is considered an elective/non-medically necessary procedure. There are many reasons parents decide to have their sons circumsized. During the first year of life circumcised males have a reduced risk of urinary tract infections but after this year the rates between circumcised males and uncircumcised males are the same.  It is safe to have your son circumcised outside of the hospital and the places above perform them regularly.  Childbirth Education Options: [facility] Department Classes:  Childbirth education classes can help you get ready for a positive parenting experience. You can also meet other expectant parents and get free stuff for your baby. Each class runs for five weeks on the same night and costs $45 for the mother-to-be and her support person. Medicaid covers the cost if you are eligible. Call [phone] to register. [facility] Childbirth Education:  [phone] or [phone] or [email or web address]  Baby & Me Class: Discuss newborn & infant parenting and family adjustment issues with other new mothers in a relaxed environment. Each week brings a new speaker or baby-centered activity. We encourage new mothers to join [place] every Thursday at 11:00am. Babies birth until crawling. No registration or fee. Daddy [employer]: This course offers Dads-to-be the tools and knowledge needed to feel confident on their journey to becoming new fathers. Experienced dads, who have been trained as coaches, teach dads-to-be how to hold, comfort, diaper, swaddle and play with their infant while being able to support the new mom as well. A class for men taught by men. $25/dad Big Brother/Big Sister: Let your children share in the joy of a new brother or sister in this special class designed just for them. Class includes discussion about how families care for babies: swaddling, holding, diapering, safety as well as how they can be helpful in their  new role. This class is designed for children ages 80 to 42, but any age is welcome. Please register each child individually. $5/child  Mom Talk: This mom-led group offers support and connection to mothers as they journey through the adjustments and struggles of that sometimes overwhelming first year after the birth of a child. Tuesdays at 10:00am and Thursdays at 6:00pm. Babies welcome. No registration or fee. Breastfeeding Support Group: This group is a mother-to-mother support circle where moms have the opportunity to share their breastfeeding experiences. A Lactation Consultant is present for questions and concerns. Meets each Tuesday at 11:00am. No fee or registration. Breastfeeding Your Baby: Learn what to expect in the first days of breastfeeding your newborn.  This class will help you feel more confident with the skills needed to begin your breastfeeding experience. Many new mothers are concerned about breastfeeding after leaving the hospital. This class will also address the most common fears and challenges about breastfeeding during the first few weeks, months and beyond. (call for fee) Comfort Techniques and Tour: This 2 hour interactive class will provide you the opportunity to learn & practice hands-on techniques that can help relieve some of the discomfort of labor and encourage your baby to rotate toward the best position for birth. You and your partner will be able to try a variety of labor positions with birth balls and rebozos as well as practice breathing,  relaxation, and visualization techniques. A tour of the [facility] is included with this class. $20 per registrant and support person Childbirth Class- Weekend Option: This class is a Weekend version of our Birth & Baby series. It is designed for parents who have a difficult time fitting several weeks of classes into their schedule. It covers the care of your newborn and the basics of labor and childbirth. It also  includes a [place] [facility] and lunch. The class is held two consecutive days: beginning on Friday evening from 6:30 - 8:30 p.m. and the next day, Saturday from 9 a.m. - 4 p.m. (call for fee) Doren Custard Class: Interested in a waterbirth?  This informational class will help you discover whether waterbirth is the right fit for you. Education about waterbirth itself, supplies you would need and how to assemble your support team is what you can expect from this class. Some obstetrical practices require this class in order to pursue a waterbirth. (Not all obstetrical practices offer waterbirth-check with your healthcare provider.) Register only the expectant mom, but you are encouraged to bring your partner to class! Required if planning waterbirth, no fee. Infant/Child CPR: Parents, grandparents, babysitters, and friends learn Cardio-Pulmonary Resuscitation skills for infants and children. You will also learn how to treat both conscious and unconscious choking in infants and children. This Family & Friends program does not offer certification. Register each participant individually to ensure that enough mannequins are available. (Call for fee) Grandparent Love: Expecting a grandbaby? This class is for you! Learn about the latest infant care and safety recommendations and ways to support your own child as he or she transitions into the parenting role. Taught by Registered Nurses who are childbirth instructors, but most importantly...they are grandmothers too! $10/person. Childbirth Class- Natural Childbirth: This series of 5 weekly classes is for expectant parents who want to learn and practice natural methods of coping with the process of labor and childbirth. Relaxation, breathing, massage, visualization, role of the partner, and helpful positioning are highlighted. Participants learn how to be confident in their body's ability to give birth. This class will empower and help parents  make informed decisions about their own care. Includes discussion that will help new parents transition into the immediate postpartum period. [facility] is included. We suggest taking this class between 25-32 weeks, but it's only a recommendation. $75 per registrant and one support person or $30 Medicaid. Childbirth Class- 3 week Series: This option of 3 weekly classes helps you and your labor partner prepare for childbirth. Newborn care, labor & birth, cesarean birth, pain management, and comfort techniques are discussed and a [place] [facility] is included. The class meets at the same time, on the same day of the week for 3 consecutive weeks beginning with the starting date you choose. $60 for registrant and one support person.  Marvelous Multiples: Expecting twins, triplets, or more? This class covers the differences in labor, birth, parenting, and breastfeeding issues that face multiples' parents. NICU tour is included. Led by a Certified Childbirth Educator who is the mother of twins. No fee. Caring for Baby: This class is for expectant and adoptive parents who want to learn and practice the most up-to-date newborn care for their babies. Focus is on birth through the first six weeks of life. Topics include feeding, bathing, diapering, crying, umbilical cord care, circumcision care and safe sleep. Parents learn to recognize symptoms of  illness and when to call the pediatrician. Register only the mom-to-be and your partner or support person can plan to come with you! $10 per registrant and support person Childbirth Class- online option: This online class offers you the freedom to complete a Birth and Baby series in the comfort of your own home. The flexibility of this option allows you to review sections at your own pace, at times convenient to you and your support people. It includes additional video information, animations, quizzes, and  extended activities. Get organized with helpful eClass tools, checklists, and trackers. Once you register online for the class, you will receive an email within a few days to accept the invitation and begin the class when the time is right for you. The content will be available to you for 60 days. $60 for 60 days of online access for you and your support people.  Local Doulas: Natural Baby Doulas [email or web address] Tel: [phone] [email or web address]/ [employer] [phone] [email or web address] .[email or web address] www.[email or web address].com The Labor Hassell Halim  (also do waterbirth tub rental) [phone] [email or web address] https://www.[email or web address]/ Triad Birth Doula [phone] [email or web address] NotebookDistributors.fi Sacred Rhythms  [phone] https://sacred-rhythms.com/ [employer] (PADA) [email or web address] https://www.frey.org/ [place] Birth and Baby  http://[email or web address]/ Considering Waterbirth? Guide for patients at [facility] [employer]  Why consider waterbirth?  . Gentle birth for babies . Less pain medicine used in labor . May allow for passive descent/less pushing . May reduce perineal tears  . More mobility and instinctive maternal position changes . Increased maternal relaxation . Reduced blood pressure in labor  Is waterbirth safe? What are the risks of infection, drowning or other complications?  . Infection: o Very low risk (3.7 % for tub vs 4.8% for bed) o 7 in 8000 waterbirths with documented infection o Poorly cleaned equipment most common cause o Slightly lower group B strep transmission rate  . Drowning o Maternal:  - Very low risk   - Related to seizures or fainting o Newborn:  - Very low risk. No evidence of increased risk of respiratory problems in multiple large studies - Physiological protection from breathing under water - Avoid  underwater birth if there are any fetal complications - Once baby's head is out of the water, keep it out.  . Birth complication o Some reports of cord trauma, but risk decreased by bringing baby to surface gradually o No evidence of increased risk of shoulder dystocia. Mothers can usually change positions faster in water than in a bed, possibly aiding the maneuvers to free the shoulder.   You must attend a Doren Custard class at [facility]  3rd Wednesday of every month from 7-9pm  [employer] by calling [phone] or online at [email or web address]  Bring [place] the certificate from the class to your prenatal appointment  Meet with a midwife at 36 weeks to see if you can still plan a waterbirth and to sign the consent.   Purchase or rent the following supplies:   Water Birth Pool (Birth Pool in a Box or [employer] for instance)  (Tubs start ~$125)  Single-use disposable tub liner designed for your brand of tub  New garden hose labeled "lead-free", "suitable for drinking water",  Electric drain pump to remove water (We recommend 792 gallon per hour or greater pump.)   Separate garden hose to remove the dirty water  Fish net  Bathing suit top (optional)  Long-handled mirror (optional)  Places to purchase or rent supplies  GotWebTools.is for tub purchases and supplies  Waterbirthsolutions.com for tub purchases and supplies  The  Labor Ladies (www.thelaborladies.com) $275 for tub rental/set-up & take down/kit   [employer] (http://www.fleming.com/.htm) Information regarding doulas (labor support) who provide pool rentals  Our practice has a Birth Pool in a Box tub at the hospital that you may borrow on a first-come-first-served basis. It is your responsibility to to set up, clean and break down the tub. We cannot guarantee the availability of this tub in advance. You are responsible for bringing all accessories listed above. If you do not  have all necessary supplies you cannot have a waterbirth.    Things that would prevent you from having a waterbirth:  Premature, <37wks  Previous cesarean birth  Presence of thick meconium-stained fluid  Multiple gestation (Twins, triplets, etc.)  Uncontrolled diabetes or gestational diabetes requiring medication  Hypertension requiring medication or diagnosis of pre-eclampsia  Heavy vaginal bleeding  Non-reassuring fetal heart rate  Active infection (MRSA, etc.). Group B Strep is NOT a contraindication for  waterbirth.  If your labor has to be induced and induction method requires continuous  monitoring of the baby's heart rate  Other risks/issues identified by your obstetrical provider  Please remember that birth is unpredictable. Under certain unforeseeable circumstances your provider may advise against giving birth in the tub. These decisions will be made on a case-by-case basis and with the safety of you and your baby as our highest priority.

## 2017-05-30 NOTE — Progress Notes (Signed)
05/30/17 Addendum:  Corrected ABO/RH/antibody screen-from AB- to AB+ per prenatal records.

## 2017-05-30 NOTE — Addendum Note (Signed)
Addended by: Faythe CasaBELLAMY, JEANETTA M on: 05/30/2017 08:44 PM   Modules accepted: Orders

## 2017-05-30 NOTE — BH Specialist Note (Signed)
Integrated Behavioral Health Initial Visit  MRN: [id number] Name: Karla Davies  Number of Integrated Behavioral Health Clinician visits:: 1/6 Session Start time: 8:50  Session End time: 9:09 Total time: 20 minutes  Type of Service: Integrated Behavioral Health- Individual/Family Interpretor:No. Interpretor Name and Language: n/a   Warm Hand Off Completed.       SUBJECTIVE: Karla Davies is a 26 y.o. female accompanied by n/a Patient was referred by  Thressa ShellerHeather Hogan, CNM for depression, anxiety. Patient reports the following symptoms/concerns: Pt states her primary concern today is feeling exhausted, as she hasn't slept well in the past two weeks; attributes symptoms to lack of sleep and relationship issues with FOB. Duration of problem: Two weeks; Severity of problem: severe  OBJECTIVE: Mood: Anxious and Depressed and Affect: Appropriate Risk of harm to self or others: No plan to harm self or others  LIFE CONTEXT: Family and Social: Lives with FOB School/Work: Working currently Self-Care: - Life Changes: Current pregnancy  GOALS ADDRESSED: Patient will: 1. Reduce symptoms of: anxiety, depression and stress 2. Increase knowledge and/or ability of: coping skills  3. Demonstrate ability to: Increase healthy adjustment to current life circumstances  INTERVENTIONS: Interventions utilized: Sleep Hygiene and Psychoeducation and/or Health Education  Standardized Assessments completed: GAD-7 and PHQ 9  ASSESSMENT: Patient currently experiencing Adjustment disorder with mixed anxious and depressed mood.   Patient may benefit from psychoeducation and brief therapeutic intervention regarding coping with symptoms of anxiety and depression.  PLAN: 1. Follow up with behavioral health clinician on : two weeks 2. Behavioral recommendations:  -Put sleep app on phone today; begin using at bedtime tonight -Read educational material regarding coping with symptoms of anxiety and  depression 3. Referral(s): [facility] ([facility] Clinic) 4. "From scale of 1-10, how likely are you to follow plan?": 9  Rae LipsJamie C Ngina Royer, LCSW  [facility] 05/30/2017  Decreased Interest 3  Down, Depressed, Hopeless 3  PHQ - 2 Score 6  Altered sleeping 3  Tired, decreased energy 3  Change in appetite 3  Feeling bad or failure about yourself  1  Trouble concentrating 2  Moving slowly or fidgety/restless 0  Suicidal thoughts 0  PHQ-9 Score 18   GAD 7 : Generalized Anxiety Score 05/30/2017  Nervous, Anxious, on Edge 3  Control/stop worrying 3  Worry too much - different things 3  Trouble relaxing 3  Restless 1  Easily annoyed or irritable 3  Afraid - awful might happen 3  Total GAD 7 Score 19

## 2017-05-30 NOTE — Progress Notes (Signed)
   PRENATAL VISIT NOTE  Subjective:  Karla Davies is a 26 y.o. G2P0010 at 5832w1d being seen today for ongoing prenatal care.  She is currently monitored for the following issues for this low-risk pregnancy and has Supervision of other normal pregnancy, antepartum and Eczema on their problem list.  Patient reports backache, fatigue and trouble sleeping, and a lot of stressors related to work. She would like a note to be out of work, and states that she does not need to work at this time. .  Contractions: Not present. Vag. Bleeding: None.  Movement: Present. Denies leaking of fluid.   The following portions of the patient's history were reviewed and updated as appropriate: allergies, current medications, past family history, past medical history, past social history, past surgical history and problem list. Problem list updated.  Objective:   Vitals:   05/30/17 0759  BP: 112/67  Pulse: (!) 101  Weight: 164 lb 4.8 oz (74.5 kg)    Fetal Status: Fetal Heart Rate (bpm): 138 Fundal Height: 25 cm Movement: Present     General:  Alert, oriented and cooperative. Patient is in no acute distress.  Skin: Skin is warm and dry. No rash noted.   Cardiovascular: Normal heart rate noted  Respiratory: Normal respiratory effort, no problems with respiration noted  Abdomen: Soft, gravid, appropriate for gestational age.  Pain/Pressure: Present     Pelvic: Cervical exam deferred        Extremities: Normal range of motion.  Edema: Trace  Mental Status:  Normal mood and affect. Normal behavior. Normal judgment and thought content.   Assessment and Plan:  Pregnancy: G2P0010 at 532w1d  1. Supervision of other normal pregnancy, antepartum  - Glucose Tolerance, 2 Hours w/1 Hour - RPR - HIV antibody - Tdap (BOOSTRIX) injection 0.5 mL - Ambulatory referral to Integrated Behavioral Health  2. Eczema, unspecified type  - hydrocortisone 2.5 % cream; Apply topically 2 (two) times daily.  Dispense: 30 g;  Refill: 0  Preterm labor symptoms and general obstetric precautions including but not limited to vaginal bleeding, contractions, leaking of fluid and fetal movement were reviewed in detail with the patient. Patient agreed to meet with  Behavioral health clinician. Please refer to After Visit Summary for other counseling recommendations.  Return in about 2 weeks (around 06/13/2017) for ROB.   Sharyon CableVeronica C Douglas Rooks, CNM

## 2017-05-30 NOTE — Progress Notes (Signed)
Addendum:  Patient and/or legal guardian verbally consented to meet with Behavioral Health Clinician about presenting concerns.

## 2017-05-31 ENCOUNTER — Other Ambulatory Visit: Payer: Self-pay | Admitting: Advanced Practice Midwife

## 2017-05-31 DIAGNOSIS — Z3480 Encounter for supervision of other normal pregnancy, unspecified trimester: Secondary | ICD-10-CM

## 2017-05-31 LAB — GLUCOSE TOLERANCE, 2 HOURS W/ 1HR
Glucose, 1 hour: 99 mg/dL (ref 65–179)
Glucose, 2 hour: 54 mg/dL — ABNORMAL LOW (ref 65–152)
Glucose, Fasting: 89 mg/dL (ref 65–91)

## 2017-05-31 LAB — HIV ANTIBODY (ROUTINE TESTING W REFLEX): HIV Screen 4th Generation wRfx: NONREACTIVE

## 2017-05-31 LAB — RPR: RPR Ser Ql: NONREACTIVE

## 2017-06-11 ENCOUNTER — Encounter (HOSPITAL_COMMUNITY): Payer: Self-pay

## 2017-06-11 ENCOUNTER — Inpatient Hospital Stay (HOSPITAL_COMMUNITY)
Admission: AD | Admit: 2017-06-11 | Discharge: 2017-06-11 | Disposition: A | Discharge status: Home / Self Care | Payer: BLUE CROSS/BLUE SHIELD | Source: Ambulatory Visit | Attending: Obstetrics and Gynecology | Admitting: Obstetrics and Gynecology

## 2017-06-11 DIAGNOSIS — O26893 Other specified pregnancy related conditions, third trimester: Secondary | ICD-10-CM | POA: Diagnosis present

## 2017-06-11 DIAGNOSIS — O9989 Other specified diseases and conditions complicating pregnancy, childbirth and the puerperium: Secondary | ICD-10-CM

## 2017-06-11 DIAGNOSIS — N898 Other specified noninflammatory disorders of vagina: Secondary | ICD-10-CM | POA: Diagnosis not present

## 2017-06-11 DIAGNOSIS — O98813 Other maternal infectious and parasitic diseases complicating pregnancy, third trimester: Secondary | ICD-10-CM | POA: Diagnosis not present

## 2017-06-11 DIAGNOSIS — B3731 Acute candidiasis of vulva and vagina: Secondary | ICD-10-CM

## 2017-06-11 DIAGNOSIS — Z3A28 28 weeks gestation of pregnancy: Secondary | ICD-10-CM | POA: Insufficient documentation

## 2017-06-11 DIAGNOSIS — Z3480 Encounter for supervision of other normal pregnancy, unspecified trimester: Secondary | ICD-10-CM

## 2017-06-11 DIAGNOSIS — Z0371 Encounter for suspected problem with amniotic cavity and membrane ruled out: Secondary | ICD-10-CM

## 2017-06-11 DIAGNOSIS — B373 Candidiasis of vulva and vagina: Secondary | ICD-10-CM | POA: Diagnosis not present

## 2017-06-11 LAB — POCT FERN TEST: POCT Fern Test: NEGATIVE

## 2017-06-11 LAB — WET PREP, GENITAL
Clue Cells Wet Prep HPF POC: NONE SEEN
Sperm: NONE SEEN
TRICH WET PREP: NONE SEEN
Yeast Wet Prep HPF POC: NONE SEEN

## 2017-06-11 LAB — AMNISURE RUPTURE OF MEMBRANE (ROM) NOT AT ARMC: AMNISURE: NEGATIVE

## 2017-06-11 MED ORDER — TERCONAZOLE 0.4 % VA CREA: 1.0000 | TOPICAL_CREAM | Freq: Every day | VAGINAL | 0 refills | Status: DC

## 2017-06-11 NOTE — MAU Note (Signed)
Pt reports gush of fluid at 0930, fluid continues to leak.

## 2017-06-11 NOTE — MAU Provider Note (Signed)
History     CSN: [id number]  Arrival date and time: 06/11/17 1115   First Provider Initiated Contact with Patient 06/11/17 1152      Chief Complaint  Patient presents with  . Vaginal Discharge   HPI Karla Davies is a 26 y.o. G2P0010 at 516w6d who presents with a gush of fluid at 1000. She states the fluid is clear and continuing to leak. She denies any pain or vaginal bleeding. She reports normal fetal movement. She reports feeling pressure in her pelvis. She states she had intercourse this morning. She denies any problems in this pregnancy and [facility].   OB History    Gravida Para Term Preterm AB Living   2       1 0   SAB TAB Ectopic Multiple Live Births     1            Past Medical History:  Diagnosis Date  . Anxiety   . Depression     Past Surgical History:  Procedure Laterality Date  . DILATION AND CURETTAGE OF UTERUS  2017    Family History  Problem Relation Age of Onset  . Heart disease Father     Social History   Tobacco Use  . Smoking status: Never Smoker  . Smokeless tobacco: Never Used  Substance Use Topics  . Alcohol use: No  . Drug use: No    Allergies: No Known Allergies  Medications Prior to Admission  Medication Sig Dispense Refill Last Dose  . hydrocortisone 2.5 % cream Apply topically 2 (two) times daily. 30 g 0 06/10/2017 at Unknown time  . Prenatal Multivit-Min-Fe-FA (PRENATAL VITAMINS PO) Take 1 tablet by mouth daily. Taking otc prenatal gummies   06/11/2017 at Unknown time  . ranitidine (ZANTAC) 150 MG tablet Take 150 mg by mouth 2 (two) times daily as needed for heartburn.   06/11/2017 at Unknown time    Review of Systems  Constitutional: Negative.  Negative for fatigue and fever.  HENT: Negative.   Respiratory: Negative.  Negative for shortness of breath.   Cardiovascular: Negative.  Negative for chest pain.  Gastrointestinal: Negative.  Negative for abdominal pain, constipation,  diarrhea, nausea and vomiting.  Genitourinary: Positive for vaginal discharge. Negative for dysuria.  Neurological: Negative.  Negative for dizziness and headaches.   Physical Exam   Blood pressure 126/62, pulse (!) 107, temperature 98.6 F (37 C), temperature source Oral, resp. rate 16, height 5\' 1"  (1.549 m), weight 168 lb (76.2 kg), last menstrual period 11/30/2016, SpO2 100 %.  Physical Exam  Nursing note and vitals reviewed. Constitutional: She is oriented to person, place, and time. She appears well-developed and well-nourished. No distress.  HENT:  Head: Normocephalic.  Eyes: Pupils are equal, round, and reactive to light.  Cardiovascular: Normal rate, regular rhythm and normal heart sounds.  Respiratory: Effort normal and breath sounds normal. No respiratory distress.  GI: Soft. Bowel sounds are normal. She exhibits no distension. There is no tenderness.  Genitourinary: Vaginal discharge found.  Genitourinary Comments: No pooling in vagina. Copious thick white adherent discharge. Cervix visually closed  Neurological: She is alert and oriented to person, place, and time.  Skin: Skin is warm and dry.  Psychiatric: She has a normal mood and affect. Her behavior is normal. Judgment and thought content normal.   Fetal Tracing:  Baseline: 145  Variability: moderate Accels: 10x10 Decels: none  Toco: none  Dilation: Closed Effacement (%): Thick Cervical Position: Posterior  Exam by:: c neill cnm  MAU Course  Procedures Results for orders placed or performed during the hospital encounter of 06/11/17 (from the past 24 hour(s))  Am[facility]     Status: None   Collection Time: 06/11/17 12:02 PM  Result Value Ref Range   Amnisure ROM NEGATIVE   Wet prep, genital     Status: Abnormal   Collection Time: 06/11/17 12:02 PM  Result Value Ref Range   Yeast Wet Prep HPF POC NONE SEEN NONE SEEN   Trich, Wet Prep NONE SEEN NONE SEEN   Clue Cells  Wet Prep HPF POC NONE SEEN NONE SEEN   WBC, Wet Prep HPF POC FEW (A) NONE SEEN   Sperm NONE SEEN    MDM UA Wet prep- negative but clinical presentation consistent with yeast, will treat Amnisure- negative Negative fern test Assessment and Plan   1. Encounter for suspected premature rupture of amniotic membranes, with rupture of membranes not found   2. Supervision of other normal pregnancy, antepartum   3. Vaginal candidiasis    -Discharge home in stable condition -Rx for terazole sent to patient's pharmacy -Preterm labor precautions discussed -Patient advised [facility] -Patient may return to MAU as needed or if her condition were to change or worsen   Rolm BookbinderCaroline M Neill CNM 06/11/2017, 12:24 PM

## 2017-06-11 NOTE — Discharge Instructions (Signed)

## 2017-06-11 NOTE — Progress Notes (Addendum)
G2P0 @ 28.[redacted] wksga. Presents to triage for PPROM at 1000 today. Denies ctx or bleeding. EFM applied. + FM   1154: provider at bs assessing. Sterile speculum exam for pooling and fern test done.   amnisure done. No pooling.   Fern test: negative Amnisure: negative  1247: D/C instructions given with pt understanding. Pt left unit via ambulaory.

## 2017-06-13 ENCOUNTER — Ambulatory Visit: Payer: BLUE CROSS/BLUE SHIELD | Admitting: Clinical

## 2017-06-13 ENCOUNTER — Ambulatory Visit (INDEPENDENT_AMBULATORY_CARE_PROVIDER_SITE_OTHER): Payer: BLUE CROSS/BLUE SHIELD | Admitting: Obstetrics and Gynecology

## 2017-06-13 VITALS — BP 120/67 | HR 94 | Wt 173.9 lb

## 2017-06-13 DIAGNOSIS — F4320 Adjustment disorder, unspecified: Secondary | ICD-10-CM

## 2017-06-13 DIAGNOSIS — Z3480 Encounter for supervision of other normal pregnancy, unspecified trimester: Secondary | ICD-10-CM

## 2017-06-13 NOTE — Progress Notes (Signed)
   PRENATAL VISIT NOTE  Subjective:  Karla Davies is a 26 y.o. G2P0010 at 6988w1d being seen today for ongoing prenatal care.  She is currently monitored for the following issues for this low-risk pregnancy and has Supervision of other normal pregnancy, antepartum and Eczema on their problem list.  Patient reports no complaints.  Contractions: Not present. Vag. Bleeding: None.  Movement: Present. Denies leaking of fluid.   The following portions of the patient's history were reviewed and updated as appropriate: allergies, current medications, past family history, past medical history, past social history, past surgical history and problem list. Problem list updated.  Objective:   Vitals:   06/13/17 1420  BP: 120/67  Pulse: 94  Weight: 173 lb 14.4 oz (78.9 kg)    Fetal Status: Fetal Heart Rate (bpm): 150 Fundal Height: 27 cm Movement: Present     General:  Alert, oriented and cooperative. Patient is in no acute distress.  Skin: Skin is warm and dry. No rash noted.   Cardiovascular: Normal heart rate noted  Respiratory: Normal respiratory effort, no problems with respiration noted  Abdomen: Soft, gravid, appropriate for gestational age.  Pain/Pressure: Absent     Pelvic: Cervical exam deferred        Extremities: Normal range of motion.  Edema: Trace  Mental Status:  Normal mood and affect. Normal behavior. Normal judgment and thought content.   Assessment and Plan:  Pregnancy: G2P0010 at 4388w1d  Supervision of other normal pregnancy, antepartum  Doing well Reviewed previous visit labs. 2 hour GTT normal List of prenatal courses given.   Preterm labor symptoms and general obstetric precautions including but not limited to vaginal bleeding, contractions, leaking of fluid and fetal movement were reviewed in detail with the patient. Please refer to After Visit Summary for other counseling recommendations.  Return in about 2 weeks (around 06/27/2017).   Venia CarbonJennifer Martinez Boxx, NP

## 2017-06-13 NOTE — BH Specialist Note (Signed)
Integrated Behavioral Health Follow Up Visit  MRN: [id number] Name: Karla Davies  Number of Integrated Behavioral Health Clinician visits: 2/6 Session Start time: 2:42 Session End time: 2:52 Total time: 10 minutes  Type of Service: Integrated Behavioral Health- Individual/Family Interpretor:No. Interpretor Name and Language: n/a  SUBJECTIVE: Karla Davies is a 26 y.o. female accompanied by n/a Patient was referred by  Thressa ShellerHeather Hogan, CNM for depression and anxiety. Patient reports the following symptoms/concerns: Pt states she has no concerns today, is feeling much better than last visit, is sleeping better, and relationship with FOB has improved. Pt looking forward to taking a nap this afternoon, then finishing up final school work in January, before baby is born.  Duration of problem: Decrease in past two weeks; Severity of problem: mild  OBJECTIVE: Mood: Appropriate and Affect: Appropriate Risk of harm to self or others: No plan to harm self or others  LIFE CONTEXT: Family and Social: Lives with FOB School/Work: Working fulltime Self-Care: Sleeping well Life Changes: Current pregnancy, will finish school in three weeks, improved relationship with FOB  GOALS ADDRESSED: Patient will: 1. Maintain reduction in symptoms of: anxiety and depression    INTERVENTIONS: Interventions utilized:  Supportive Counseling Standardized Assessments completed: GAD-7 and PHQ 9  ASSESSMENT: Patient currently experiencing Adjustment disorder in remission.  Patient may benefit from supportive counseling.  PLAN: 1. Follow up with behavioral health clinician on : As needed 2. Behavioral recommendations:  -Consider childbirth education classes -Take nap this afternoon -Continue with self-coping apps, as needed 3. Referral(s): [facility] 4. "From scale of 1-10, how likely are you to follow plan?": 10  Rae LipsJamie C Oceane Fosse, LCSW  Depression screen [facility] 2/[facility]  06/13/2017 05/30/2017  Decreased Interest 0 3  Down, Depressed, Hopeless 0 3  PHQ - 2 Score 0 6  Altered sleeping 0 3  Tired, decreased energy 1 3  Change in appetite 1 3  Feeling bad or failure about yourself  0 1  Trouble concentrating 0 2  Moving slowly or fidgety/restless 0 0  Suicidal thoughts 0 0  PHQ-9 Score 2 18   GAD 7 : Generalized Anxiety Score 06/13/2017 05/30/2017  Nervous, Anxious, on Edge 0 3  Control/stop worrying 0 3  Worry too much - different things 0 3  Trouble relaxing 0 3  Restless 0 1  Easily annoyed or irritable 2 3  Afraid - awful might happen 0 3  Total GAD 7 Score 2 19

## 2017-06-13 NOTE — Patient Instructions (Signed)

## 2017-06-29 ENCOUNTER — Ambulatory Visit (INDEPENDENT_AMBULATORY_CARE_PROVIDER_SITE_OTHER): Payer: BLUE CROSS/BLUE SHIELD | Admitting: Student

## 2017-06-29 ENCOUNTER — Encounter: Payer: Self-pay | Admitting: Student

## 2017-06-29 VITALS — BP 125/75 | HR 108 | Wt 173.0 lb

## 2017-06-29 DIAGNOSIS — Z3480 Encounter for supervision of other normal pregnancy, unspecified trimester: Secondary | ICD-10-CM

## 2017-06-29 NOTE — Progress Notes (Signed)
   PRENATAL VISIT NOTE  Subjective:  Karla Davies is a 27 y.o. G2P0010 at 365w3d being seen today for ongoing prenatal care.  She is currently monitored for the following issues for this low-risk pregnancy and has Supervision of other normal pregnancy, antepartum and Eczema on their problem list.  Patient reports no complaints.  Contractions: Not present. Vag. Bleeding: None.  Movement: Present. Denies leaking of fluid.   The following portions of the patient's history were reviewed and updated as appropriate: allergies, current medications, past family history, past medical history, past social history, past surgical history and problem list. Problem list updated.  Objective:   Vitals:   06/29/17 1408  BP: 125/75  Pulse: (!) 108  Weight: 173 lb (78.5 kg)    Fetal Status: Fetal Heart Rate (bpm): 148 Fundal Height: 30 cm Movement: Present     General:  Alert, oriented and cooperative. Patient is in no acute distress.  Skin: Skin is warm and dry. No rash noted.   Cardiovascular: Normal heart rate noted  Respiratory: Normal respiratory effort, no problems with respiration noted  Abdomen: Soft, gravid, appropriate for gestational age.  Pain/Pressure: Absent     Pelvic: Cervical exam deferred        Extremities: Normal range of motion.  Edema: Trace  Mental Status:  Normal mood and affect. Normal behavior. Normal judgment and thought content.   Assessment and Plan:  Pregnancy: G2P0010 at 4365w3d  1. Supervision of other normal pregnancy, antepartum   Preterm labor symptoms and general obstetric precautions including but not limited to vaginal bleeding, contractions, leaking of fluid and fetal movement were reviewed in detail with the patient. Please refer to After Visit Summary for other counseling recommendations.  Return in about 2 weeks (around 07/13/2017) for Routine OB.   Judeth HornErin Shaletha Humble, NP

## 2017-06-29 NOTE — Patient Instructions (Signed)

## 2017-07-18 ENCOUNTER — Encounter: Payer: Self-pay | Admitting: Advanced Practice Midwife

## 2017-07-18 ENCOUNTER — Ambulatory Visit (INDEPENDENT_AMBULATORY_CARE_PROVIDER_SITE_OTHER): Payer: BLUE CROSS/BLUE SHIELD | Admitting: Advanced Practice Midwife

## 2017-07-18 VITALS — BP 122/61 | HR 110 | Wt 173.0 lb

## 2017-07-18 DIAGNOSIS — Z3480 Encounter for supervision of other normal pregnancy, unspecified trimester: Secondary | ICD-10-CM

## 2017-07-18 DIAGNOSIS — Z3483 Encounter for supervision of other normal pregnancy, third trimester: Secondary | ICD-10-CM

## 2017-07-18 NOTE — Progress Notes (Signed)
   PRENATAL VISIT NOTE  Subjective:  Karla Davies is a 27 y.o. G2P0010 at 6866w1d being seen today for ongoing prenatal care.  She is currently monitored for the following issues for this low-risk pregnancy and has Supervision of other normal pregnancy, antepartum and Eczema on their problem list.  Patient reports no complaints.  Contractions: Not present. Vag. Bleeding: None.  Movement: Present. Denies leaking of fluid.   The following portions of the patient's history were reviewed and updated as appropriate: allergies, current medications, past family history, past medical history, past social history, past surgical history and problem list. Problem list updated.  Objective:   Vitals:   07/18/17 1435  BP: 122/61  Pulse: (!) 110  Weight: 173 lb (78.5 kg)    Fetal Status: Fetal Heart Rate (bpm): 147 Fundal Height: 34 cm Movement: Present     General:  Alert, oriented and cooperative. Patient is in no acute distress.  Skin: Skin is warm and dry. No rash noted.   Cardiovascular: Normal heart rate noted  Respiratory: Normal respiratory effort, no problems with respiration noted  Abdomen: Soft, gravid, appropriate for gestational age.  Pain/Pressure: Present     Pelvic: Cervical exam deferred        Extremities: Normal range of motion.  Edema: None  Mental Status:  Normal mood and affect. Normal behavior. Normal judgment and thought content.   Assessment and Plan:  Pregnancy: G2P0010 at 6666w1d  1. Supervision of other normal pregnancy, antepartum - Routine care - GBS at next visit   Preterm labor symptoms and general obstetric precautions including but not limited to vaginal bleeding, contractions, leaking of fluid and fetal movement were reviewed in detail with the patient. Please refer to After Visit Summary for other counseling recommendations.  Return in about 2 weeks (around 08/01/2017).   Thressa ShellerHeather Sontee Desena, CNM

## 2017-08-01 ENCOUNTER — Other Ambulatory Visit (HOSPITAL_COMMUNITY)
Admission: RE | Admit: 2017-08-01 | Discharge: 2017-08-01 | Disposition: A | Discharge status: Home / Self Care | Payer: BLUE CROSS/BLUE SHIELD | Source: Ambulatory Visit | Attending: Advanced Practice Midwife | Admitting: Advanced Practice Midwife

## 2017-08-01 ENCOUNTER — Ambulatory Visit (INDEPENDENT_AMBULATORY_CARE_PROVIDER_SITE_OTHER): Payer: BLUE CROSS/BLUE SHIELD | Admitting: Advanced Practice Midwife

## 2017-08-01 VITALS — BP 120/75 | HR 114 | Wt 174.7 lb

## 2017-08-01 DIAGNOSIS — Z3A36 36 weeks gestation of pregnancy: Secondary | ICD-10-CM | POA: Insufficient documentation

## 2017-08-01 DIAGNOSIS — Z3483 Encounter for supervision of other normal pregnancy, third trimester: Secondary | ICD-10-CM | POA: Diagnosis present

## 2017-08-01 DIAGNOSIS — Z3480 Encounter for supervision of other normal pregnancy, unspecified trimester: Secondary | ICD-10-CM

## 2017-08-01 NOTE — Progress Notes (Signed)
Pt thinks may have a Yeast infection but does not itch but has d/c

## 2017-08-01 NOTE — Progress Notes (Signed)
   PRENATAL VISIT NOTE  Subjective:  Karla Davies is a 27 y.o. G2P0010 at 6978w1d being seen today for ongoing prenatal care.  She is currently monitored for the following issues for this low-risk pregnancy and has Supervision of other normal pregnancy, antepartum and Eczema on their problem list.  Patient reports no complaints.   . Vag. Bleeding: None.  Movement: Present. Denies leaking of fluid.   The following portions of the patient's history were reviewed and updated as appropriate: allergies, current medications, past family history, past medical history, past social history, past surgical history and problem list. Problem list updated.  Objective:   Vitals:   08/01/17 1436  BP: 120/75  Pulse: (!) 114  Weight: 174 lb 11.2 oz (79.2 kg)    Fetal Status: Fetal Heart Rate (bpm): 155 Fundal Height: 36 cm Movement: Present     General:  Alert, oriented and cooperative. Patient is in no acute distress.  Skin: Skin is warm and dry. No rash noted.   Cardiovascular: Normal heart rate noted  Respiratory: Normal respiratory effort, no problems with respiration noted  Abdomen: Soft, gravid, appropriate for gestational age.  Pain/Pressure: Present     Pelvic: Cervical exam performed Dilation: Closed Effacement (%): Thick Station: Ballotable  Extremities: Normal range of motion.  Edema: Trace  Mental Status:  Normal mood and affect. Normal behavior. Normal judgment and thought content.   Assessment and Plan:  Pregnancy: G2P0010 at 1578w1d  1. Supervision of other normal pregnancy, antepartum  - Culture, beta strep (group b only) - GC/Chlamydia probe amp ([place]  Term labor symptoms and general obstetric precautions including but not limited to vaginal bleeding, contractions, leaking of fluid and fetal movement were reviewed in detail with the patient. Please refer to After Visit Summary for other counseling recommendations.  Return in about 1 week (around  08/08/2017).   Thressa ShellerHeather Hogan, CNM

## 2017-08-01 NOTE — Patient Instructions (Signed)
Third Trimester of Pregnancy The third trimester is from week 28 through week 40 (months 7 through 9). The third trimester is a time when the unborn baby (fetus) is growing rapidly. At the end of the ninth month, the fetus is about 20 inches in length and weighs 6-10 pounds. Body changes during your third trimester Your body will continue to go through many changes during pregnancy. The changes vary from woman to woman. During the third trimester:  Your weight will continue to increase. You can expect to gain 25-35 pounds (11-16 kg) by the end of the pregnancy.  You may begin to get stretch marks on your hips, abdomen, and breasts.  You may urinate more often because the fetus is moving lower into your pelvis and pressing on your bladder.  You may develop or continue to have heartburn. This is caused by increased hormones that slow down muscles in the digestive tract.  You may develop or continue to have constipation because increased hormones slow digestion and cause the muscles that push waste through your intestines to relax.  You may develop hemorrhoids. These are swollen veins (varicose veins) in the rectum that can itch or be painful.  You may develop swollen, bulging veins (varicose veins) in your legs.  You may have increased body aches in the pelvis, back, or thighs. This is due to weight gain and increased hormones that are relaxing your joints.  You may have changes in your hair. These can include thickening of your hair, rapid growth, and changes in texture. Some women also have hair loss during or after pregnancy, or hair that feels dry or thin. Your hair will most likely return to normal after your baby is born.  Your breasts will continue to grow and they will continue to become tender. A yellow fluid (colostrum) may leak from your breasts. This is the first milk you are producing for your baby.  Your belly button may stick out.  You may notice more swelling in your hands,  face, or ankles.  You may have increased tingling or numbness in your hands, arms, and legs. The skin on your belly may also feel numb.  You may feel short of breath because of your expanding uterus.  You may have more problems sleeping. This can be caused by the size of your belly, increased need to urinate, and an increase in your body's metabolism.  You may notice the fetus "dropping," or moving lower in your abdomen (lightening).  You may have increased vaginal discharge.  You may notice your joints feel loose and you may have pain around your pelvic bone.  What to expect at prenatal visits You will have prenatal exams every 2 weeks until week 36. Then you will have weekly prenatal exams. During a routine prenatal visit:  You will be weighed to make sure you and the baby are growing normally.  Your blood pressure will be taken.  Your abdomen will be measured to track your baby's growth.  The fetal heartbeat will be listened to.  Any test results from the previous visit will be discussed.  You may have a cervical check near your due date to see if your cervix has softened or thinned (effaced).  You will be tested for Group B streptococcus. This happens between 35 and 37 weeks.  Your health care provider may ask you:  What your birth plan is.  How you are feeling.  If you are feeling the baby move.  If you have had   any abnormal symptoms, such as leaking fluid, bleeding, severe headaches, or abdominal cramping.  If you are using any tobacco products, including cigarettes, chewing tobacco, and electronic cigarettes.  If you have any questions.  Other tests or screenings that may be performed during your third trimester include:  Blood tests that check for low iron levels (anemia).  Fetal testing to check the health, activity level, and growth of the fetus. Testing is done if you have certain medical conditions or if there are problems during the  pregnancy.  Nonstress test (NST). This test checks the health of your baby to make sure there are no signs of problems, such as the baby not getting enough oxygen. During this test, a belt is placed around your belly. The baby is made to move, and its heart rate is monitored during movement.  What is false labor? False labor is a condition in which you feel small, irregular tightenings of the muscles in the womb (contractions) that usually go away with rest, changing position, or drinking water. These are called Braxton Hicks contractions. Contractions may last for hours, days, or even weeks before true labor sets in. If contractions come at regular intervals, become more frequent, increase in intensity, or become painful, you should see your health care provider. What are the signs of labor?  Abdominal cramps.  Regular contractions that start at 10 minutes apart and become stronger and more frequent with time.  Contractions that start on the top of the uterus and spread down to the lower abdomen and back.  Increased pelvic pressure and dull back pain.  A watery or bloody mucus discharge that comes from the vagina.  Leaking of amniotic fluid. This is also known as your "water breaking." It could be a slow trickle or a gush. Let your health care provider know if it has a color or strange odor. If you have any of these signs, call your health care provider right away, even if it is before your due date. Follow these instructions at home: Medicines  Follow your health care provider's instructions regarding medicine use. Specific medicines may be either safe or unsafe to take during pregnancy.  Take a prenatal vitamin that contains at least 600 micrograms (mcg) of folic acid.  If you develop constipation, try taking a stool softener if your health care provider approves. Eating and drinking  Eat a balanced diet that includes fresh fruits and vegetables, whole grains, good sources of protein  such as meat, eggs, or tofu, and low-fat dairy. Your health care provider will help you determine the amount of weight gain that is right for you.  Avoid raw meat and uncooked cheese. These carry germs that can cause birth defects in the baby.  If you have low calcium intake from food, talk to your health care provider about whether you should take a daily calcium supplement.  Eat four or five small meals rather than three large meals a day.  Limit foods that are high in fat and processed sugars, such as fried and sweet foods.  To prevent constipation: ? Drink enough fluid to keep your urine clear or pale yellow. ? Eat foods that are high in fiber, such as fresh fruits and vegetables, whole grains, and beans. Activity  Exercise only as directed by your health care provider. Most women can continue their usual exercise routine during pregnancy. Try to exercise for 30 minutes at least 5 days a week. Stop exercising if you experience uterine contractions.  Avoid heavy   lifting.  Do not exercise in extreme heat or humidity, or at high altitudes.  Wear low-heel, comfortable shoes.  Practice good posture.  You may continue to have sex unless your health care provider tells you otherwise. Relieving pain and discomfort  Take frequent breaks and rest with your legs elevated if you have leg cramps or low back pain.  Take warm sitz baths to soothe any pain or discomfort caused by hemorrhoids. Use hemorrhoid cream if your health care provider approves.  Wear a good support bra to prevent discomfort from breast tenderness.  If you develop varicose veins: ? Wear support pantyhose or compression stockings as told by your healthcare provider. ? Elevate your feet for 15 minutes, 3-4 times a day. Prenatal care  Write down your questions. Take them to your prenatal visits.  Keep all your prenatal visits as told by your health care provider. This is important. Safety  Wear your seat belt at  all times when driving.  Make a list of emergency phone numbers, including numbers for family, friends, the hospital, and police and fire departments. General instructions  Avoid cat litter boxes and soil used by cats. These carry germs that can cause birth defects in the baby. If you have a cat, ask someone to clean the litter box for you.  Do not travel far distances unless it is absolutely necessary and only with the approval of your health care provider.  Do not use hot tubs, steam rooms, or saunas.  Do not drink alcohol.  Do not use any products that contain nicotine or tobacco, such as cigarettes and e-cigarettes. If you need help quitting, ask your health care provider.  Do not use any medicinal herbs or unprescribed drugs. These chemicals affect the formation and growth of the baby.  Do not douche or use tampons or scented sanitary pads.  Do not cross your legs for long periods of time.  To prepare for the arrival of your baby: ? Take prenatal classes to understand, practice, and ask questions about labor and delivery. ? Make a trial run to the hospital. ? Visit the hospital and tour the maternity area. ? Arrange for maternity or paternity leave through employers. ? Arrange for family and friends to take care of pets while you are in the hospital. ? Purchase a rear-facing car seat and make sure you know how to install it in your car. ? Pack your hospital bag. ? Prepare the baby's nursery. Make sure to remove all pillows and stuffed animals from the baby's crib to prevent suffocation.  Visit your dentist if you have not gone during your pregnancy. Use a soft toothbrush to brush your teeth and be gentle when you floss. Contact a health care provider if:  You are unsure if you are in labor or if your water has broken.  You become dizzy.  You have mild pelvic cramps, pelvic pressure, or nagging pain in your abdominal area.  You have lower back pain.  You have persistent  nausea, vomiting, or diarrhea.  You have an unusual or bad smelling vaginal discharge.  You have pain when you urinate. Get help right away if:  Your water breaks before 37 weeks.  You have regular contractions less than 5 minutes apart before 37 weeks.  You have a fever.  You are leaking fluid from your vagina.  You have spotting or bleeding from your vagina.  You have severe abdominal pain or cramping.  You have rapid weight loss or weight gain.    You have shortness of breath with chest pain.  You notice sudden or extreme swelling of your face, hands, ankles, feet, or legs.  Your baby makes fewer than 10 movements in 2 hours.  You have severe headaches that do not go away when you take medicine.  You have vision changes. Summary  The third trimester is from week 28 through week 40, months 7 through 9. The third trimester is a time when the unborn baby (fetus) is growing rapidly.  During the third trimester, your discomfort may increase as you and your baby continue to gain weight. You may have abdominal, leg, and back pain, sleeping problems, and an increased need to urinate.  During the third trimester your breasts will keep growing and they will continue to become tender. A yellow fluid (colostrum) may leak from your breasts. This is the first milk you are producing for your baby.  False labor is a condition in which you feel small, irregular tightenings of the muscles in the womb (contractions) that eventually go away. These are called Braxton Hicks contractions. Contractions may last for hours, days, or even weeks before true labor sets in.  Signs of labor can include: abdominal cramps; regular contractions that start at 10 minutes apart and become stronger and more frequent with time; watery or bloody mucus discharge that comes from the vagina; increased pelvic pressure and dull back pain; and leaking of amniotic fluid. This information is not intended to replace advice  given to you by your health care provider. Make sure you discuss any questions you have with your health care provider. Document Released: 06/08/2001 Document Revised: 11/20/2015 Document Reviewed: 08/15/2012 [employer] Interactive Patient Education  2017 [employer]  

## 2017-08-02 ENCOUNTER — Encounter: Payer: Self-pay | Admitting: Advanced Practice Midwife

## 2017-08-02 LAB — GC/CHLAMYDIA PROBE AMP (~~LOC~~) NOT AT ARMC
Bacterial vaginitis: NEGATIVE
Candida vaginitis: NEGATIVE
Chlamydia: NEGATIVE
NEISSERIA GONORRHEA: NEGATIVE
TRICH (WINDOWPATH): NEGATIVE

## 2017-08-04 ENCOUNTER — Other Ambulatory Visit: Payer: Self-pay | Admitting: Certified Nurse Midwife

## 2017-08-04 DIAGNOSIS — L309 Dermatitis, unspecified: Secondary | ICD-10-CM

## 2017-08-05 LAB — CULTURE, BETA STREP (GROUP B ONLY): Strep Gp B Culture: NEGATIVE

## 2017-08-08 ENCOUNTER — Encounter: Payer: Self-pay | Admitting: Advanced Practice Midwife

## 2017-08-09 MED ORDER — HYDROCORTISONE 2.5 % EX CREA: TOPICAL_CREAM | Freq: Two times a day (BID) | CUTANEOUS | 0 refills | Status: DC

## 2017-08-15 ENCOUNTER — Ambulatory Visit (INDEPENDENT_AMBULATORY_CARE_PROVIDER_SITE_OTHER): Payer: BLUE CROSS/BLUE SHIELD | Admitting: Advanced Practice Midwife

## 2017-08-15 ENCOUNTER — Encounter: Payer: Self-pay | Admitting: Advanced Practice Midwife

## 2017-08-15 DIAGNOSIS — Z3480 Encounter for supervision of other normal pregnancy, unspecified trimester: Secondary | ICD-10-CM

## 2017-08-15 NOTE — Progress Notes (Signed)
Pt asked if membranes can be scraped

## 2017-08-15 NOTE — Patient Instructions (Signed)
Vaginal delivery means that you will give birth by pushing your baby out of your birth canal (vagina). A team of health care providers will help you before, during, and after vaginal delivery. Birth experiences are unique for every woman and every pregnancy, and birth experiences vary depending on where you choose to give birth. What should I do to prepare for my baby's birth? Before your baby is born, it is important to talk with your health care provider about:  Your labor and delivery preferences. These may include: ? Medicines that you may be given. ? How you will manage your pain. This might include non-medical pain relief techniques or injectable pain relief such as epidural analgesia. ? How you and your baby will be monitored during labor and delivery. ? Who may be in the labor and delivery room with you. ? Your feelings about surgical delivery of your baby (cesarean delivery, or C-section) if this becomes necessary. ? Your feelings about receiving donated blood through an IV tube (blood transfusion) if this becomes necessary.  Whether you are able: ? To take pictures or videos of the birth. ? To eat during labor and delivery. ? To move around, walk, or change positions during labor and delivery.  What to expect after your baby is born, such as: ? Whether delayed umbilical cord clamping and cutting is offered. ? Who will care for your baby right after birth. ? Medicines or tests that may be recommended for your baby. ? Whether breastfeeding is supported in your hospital or birth center. ? How long you will be in the hospital or birth center.  How any medical conditions you have may affect your baby or your labor and delivery experience.  To prepare for your baby's birth, you should also:  Attend all of your health care visits before delivery (prenatal visits) as recommended by your health care provider. This is important.  Prepare your home for your baby's arrival. Make sure  that you have: ? Diapers. ? Baby clothing. ? Feeding equipment. ? Safe sleeping arrangements for you and your baby.  Install a car seat in your vehicle. Have your car seat checked by a certified car seat installer to make sure that it is installed safely.  Think about who will help you with your new baby at home for at least the first several weeks after delivery.  What can I expect when I arrive at the birth center or hospital? Once you are in labor and have been admitted into the hospital or birth center, your health care provider may:  Review your pregnancy history and any concerns you have.  Insert an IV tube into one of your veins. This is used to give you fluids and medicines.  Check your blood pressure, pulse, temperature, and heart rate (vital signs).  Check whether your bag of water (amniotic sac) has broken (ruptured).  Talk with you about your birth plan and discuss pain control options.  Monitoring Your health care provider may monitor your contractions (uterine monitoring) and your baby's heart rate (fetal monitoring). You may need to be monitored:  Often, but not continuously (intermittently).  All the time or for long periods at a time (continuously). Continuous monitoring may be needed if: ? You are taking certain medicines, such as medicine to relieve pain or make your contractions stronger. ? You have pregnancy or labor complications.  Monitoring may be done by:  Placing a special stethoscope or a handheld monitoring device on your abdomen to check your   baby's heartbeat, and feeling your abdomen for contractions. This method of monitoring does not continuously record your baby's heartbeat or your contractions.  Placing monitors on your abdomen (external monitors) to record your baby's heartbeat and the frequency and length of contractions. You may not have to wear external monitors all the time.  Placing monitors inside of your uterus (internal monitors) to  record your baby's heartbeat and the frequency, length, and strength of your contractions. ? Your health care provider may use internal monitors if he or she needs more information about the strength of your contractions or your baby's heart rate. ? Internal monitors are put in place by passing a thin, flexible wire through your vagina and into your uterus. Depending on the type of monitor, it may remain in your uterus or on your baby's head until birth. ? Your health care provider will discuss the benefits and risks of internal monitoring with you and will ask for your permission before inserting the monitors.  Telemetry. This is a type of continuous monitoring that can be done with external or internal monitors. Instead of having to stay in bed, you are able to move around during telemetry. Ask your health care provider if telemetry is an option for you.  Physical exam Your health care provider may perform a physical exam. This may include:  Checking whether your baby is positioned: ? With the head toward your vagina (head-down). This is most common. ? With the head toward the top of your uterus (head-up or breech). If your baby is in a breech position, your health care provider may try to turn your baby to a head-down position so you can deliver vaginally. If it does not seem that your baby can be born vaginally, your provider may recommend surgery to deliver your baby. In rare cases, you may be able to deliver vaginally if your baby is head-up (breech delivery). ? Lying sideways (transverse). Babies that are lying sideways cannot be delivered vaginally.  Checking your cervix to determine: ? Whether it is thinning out (effacing). ? Whether it is opening up (dilating). ? How low your baby has moved into your birth canal.  What are the three stages of labor and delivery?  Normal labor and delivery is divided into the following three stages: Stage 1  Stage 1 is the longest stage of labor,  and it can last for hours or days. Stage 1 includes: ? Early labor. This is when contractions may be irregular, or regular and mild. Generally, early labor contractions are more than 10 minutes apart. ? Active labor. This is when contractions get longer, more regular, more frequent, and more intense. ? The transition phase. This is when contractions happen very close together, are very intense, and may last longer than during any other part of labor.  Contractions generally feel mild, infrequent, and irregular at first. They get stronger, more frequent (about every 2-3 minutes), and more regular as you progress from early labor through active labor and transition.  Many women progress through stage 1 naturally, but you may need help to continue making progress. If this happens, your health care provider may talk with you about: ? Rupturing your amniotic sac if it has not ruptured yet. ? Giving you medicine to help make your contractions stronger and more frequent.  Stage 1 ends when your cervix is completely dilated to 4 inches (10 cm) and completely effaced. This happens at the end of the transition phase. Stage 2  Once your cervix   is completely effaced and dilated to 4 inches (10 cm), you may start to feel an urge to push. It is common for the body to naturally take a rest before feeling the urge to push, especially if you received an epidural or certain other pain medicines. This rest period may last for up to 1-2 hours, depending on your unique labor experience.  During stage 2, contractions are generally less painful, because pushing helps relieve contraction pain. Instead of contraction pain, you may feel stretching and burning pain, especially when the widest part of your baby's head passes through the vaginal opening (crowning).  Your health care provider will closely monitor your pushing progress and your baby's progress through the vagina during stage 2.  Your health care provider may  massage the area of skin between your vaginal opening and anus (perineum) or apply warm compresses to your perineum. This helps it stretch as the baby's head starts to crown, which can help prevent perineal tearing. ? In some cases, an incision may be made in your perineum (episiotomy) to allow the baby to pass through the vaginal opening. An episiotomy helps to make the opening of the vagina larger to allow more room for the baby to fit through.  It is very important to breathe and focus so your health care provider can control the delivery of your baby's head. Your health care provider may have you decrease the intensity of your pushing, to help prevent perineal tearing.  After delivery of your baby's head, the shoulders and the rest of the body generally deliver very quickly and without difficulty.  Once your baby is delivered, the umbilical cord may be cut right away, or this may be delayed for 1-2 minutes, depending on your baby's health. This may vary among health care providers, hospitals, and birth centers.  If you and your baby are healthy enough, your baby may be placed on your chest or abdomen to help maintain the baby's temperature and to help you bond with each other. Some mothers and babies start breastfeeding at this time. Your health care team will dry your baby and help keep your baby warm during this time.  Your baby may need immediate care if he or she: ? Showed signs of distress during labor. ? Has a medical condition. ? Was born too early (prematurely). ? Had a bowel movement before birth (meconium). ? Shows signs of difficulty transitioning from being inside the uterus to being outside of the uterus. If you are planning to breastfeed, your health care team will help you begin a feeding. Stage 3  The third stage of labor starts immediately after the birth of your baby and ends after you deliver the placenta. The placenta is an organ that develops during pregnancy to provide  oxygen and nutrients to your baby in the womb.  Delivering the placenta may require some pushing, and you may have mild contractions. Breastfeeding can stimulate contractions to help you deliver the placenta.  After the placenta is delivered, your uterus should tighten (contract) and become firm. This helps to stop bleeding in your uterus. To help your uterus contract and to control bleeding, your health care provider may: ? Give you medicine by injection, through an IV tube, by mouth, or through your rectum (rectally). ? Massage your abdomen or perform a vaginal exam to remove any blood clots that are left in your uterus. ? Empty your bladder by placing a thin, flexible tube (catheter) into your bladder. ? Encourage you to   breastfeed your baby. After labor is over, you and your baby will be monitored closely to ensure that you are both healthy until you are ready to go home. Your health care team will teach you how to care for yourself and your baby. This information is not intended to replace advice given to you by your health care provider. Make sure you discuss any questions you have with your health care provider. Document Released: 03/23/2008 Document Revised: 01/02/2016 Document Reviewed: 06/29/2015 [employer] Interactive Patient Education  2018 [employer]  

## 2017-08-15 NOTE — Progress Notes (Signed)
   PRENATAL VISIT NOTE  Subjective:  Karla Davies is a 27 y.o. G2P0010 at 764w1d being seen today for ongoing prenatal care.  She is currently monitored for the following issues for this low-risk pregnancy and has Supervision of other normal pregnancy, antepartum and Eczema on their problem list.  Patient reports no complaints.  Contractions: Irritability. Vag. Bleeding: None.  Movement: Present. Denies leaking of fluid.   The following portions of the patient's history were reviewed and updated as appropriate: allergies, current medications, past family history, past medical history, past social history, past surgical history and problem list. Problem list updated.  Objective:   Vitals:   08/15/17 1410  BP: 120/74  Pulse: (!) 112  Weight: 176 lb 11.2 oz (80.2 kg)    Fetal Status: Fetal Heart Rate (bpm): 148 Fundal Height: 37 cm Movement: Present  Presentation: Vertex  General:  Alert, oriented and cooperative. Patient is in no acute distress.  Skin: Skin is warm and dry. No rash noted.   Cardiovascular: Normal heart rate noted  Respiratory: Normal respiratory effort, no problems with respiration noted  Abdomen: Soft, gravid, appropriate for gestational age.  Pain/Pressure: Present     Pelvic: Cervical exam performed Dilation: Closed Effacement (%): Thick Station: -2  Extremities: Normal range of motion.  Edema: Trace  Mental Status:  Normal mood and affect. Normal behavior. Normal judgment and thought content.   Assessment and Plan:  Pregnancy: G2P0010 at 354w1d  1. Supervision of other normal pregnancy, antepartum - Routine care - Patient feeling uncomfortable and tired. Not sleeping well. Discussed ways to improve sleep (warm bath, massage, tylenol PM PRN). Requesting membrane sweep, told no sweeps before 39 weeks, cervix closed today. Reassurance provided.    Term labor symptoms and general obstetric precautions including but not limited to vaginal bleeding, contractions,  leaking of fluid and fetal movement were reviewed in detail with the patient. Please refer to After Visit Summary for other counseling recommendations.  Return in about 1 week (around 08/22/2017).   Thressa ShellerHeather Kennadee Walthour, CNM

## 2017-08-22 ENCOUNTER — Ambulatory Visit (INDEPENDENT_AMBULATORY_CARE_PROVIDER_SITE_OTHER): Payer: BLUE CROSS/BLUE SHIELD | Admitting: Advanced Practice Midwife

## 2017-08-22 ENCOUNTER — Encounter: Payer: Self-pay | Admitting: Advanced Practice Midwife

## 2017-08-22 VITALS — BP 125/72 | HR 122 | Wt 178.0 lb

## 2017-08-22 DIAGNOSIS — Z3480 Encounter for supervision of other normal pregnancy, unspecified trimester: Secondary | ICD-10-CM

## 2017-08-22 NOTE — Progress Notes (Signed)
   PRENATAL VISIT NOTE  Subjective:  Karla Davies is a 27 y.o. G2P0010 at 5280w1d being seen today for ongoing prenatal care.  She is currently monitored for the following issues for this low-risk pregnancy and has Supervision of other normal pregnancy, antepartum and Eczema on their problem list.  Patient reports no complaints.  Contractions: Not present. Vag. Bleeding: None.  Movement: Present. Denies leaking of fluid.   The following portions of the patient's history were reviewed and updated as appropriate: allergies, current medications, past family history, past medical history, past social history, past surgical history and problem list. Problem list updated.  Objective:   Vitals:   08/22/17 1432  BP: 125/72  Pulse: (!) 122  Weight: 178 lb (80.7 kg)    Fetal Status: Fetal Heart Rate (bpm): 146 Fundal Height: 38 cm Movement: Present     General:  Alert, oriented and cooperative. Patient is in no acute distress.  Skin: Skin is warm and dry. No rash noted.   Cardiovascular: Normal heart rate noted  Respiratory: Normal respiratory effort, no problems with respiration noted  Abdomen: Soft, gravid, appropriate for gestational age.  Pain/Pressure: Absent     Pelvic: Cervical exam performed Dilation: Fingertip Effacement (%): Thick Station: -2  Extremities: Normal range of motion.  Edema: Trace  Mental Status:  Normal mood and affect. Normal behavior. Normal judgment and thought content.   Assessment and Plan:  Pregnancy: G2P0010 at 6980w1d  1. Supervision of other normal pregnancy, antepartum - Post-dates management discussed with patient today - BPP/NST at next visit   Term labor symptoms and general obstetric precautions including but not limited to vaginal bleeding, contractions, leaking of fluid and fetal movement were reviewed in detail with the patient. Please refer to After Visit Summary for other counseling recommendations.  Return in 9 days (on 08/31/2017).   Thressa ShellerHeather  Dorien Mayotte, CNM

## 2017-08-22 NOTE — Patient Instructions (Signed)
Vaginal delivery means that you will give birth by pushing your baby out of your birth canal (vagina). A team of health care providers will help you before, during, and after vaginal delivery. Birth experiences are unique for every woman and every pregnancy, and birth experiences vary depending on where you choose to give birth. What should I do to prepare for my baby's birth? Before your baby is born, it is important to talk with your health care provider about:  Your labor and delivery preferences. These may include: ? Medicines that you may be given. ? How you will manage your pain. This might include non-medical pain relief techniques or injectable pain relief such as epidural analgesia. ? How you and your baby will be monitored during labor and delivery. ? Who may be in the labor and delivery room with you. ? Your feelings about surgical delivery of your baby (cesarean delivery, or C-section) if this becomes necessary. ? Your feelings about receiving donated blood through an IV tube (blood transfusion) if this becomes necessary.  Whether you are able: ? To take pictures or videos of the birth. ? To eat during labor and delivery. ? To move around, walk, or change positions during labor and delivery.  What to expect after your baby is born, such as: ? Whether delayed umbilical cord clamping and cutting is offered. ? Who will care for your baby right after birth. ? Medicines or tests that may be recommended for your baby. ? Whether breastfeeding is supported in your hospital or birth center. ? How long you will be in the hospital or birth center.  How any medical conditions you have may affect your baby or your labor and delivery experience.  To prepare for your baby's birth, you should also:  Attend all of your health care visits before delivery (prenatal visits) as recommended by your health care provider. This is important.  Prepare your home for your baby's arrival. Make sure  that you have: ? Diapers. ? Baby clothing. ? Feeding equipment. ? Safe sleeping arrangements for you and your baby.  Install a car seat in your vehicle. Have your car seat checked by a certified car seat installer to make sure that it is installed safely.  Think about who will help you with your new baby at home for at least the first several weeks after delivery.  What can I expect when I arrive at the birth center or hospital? Once you are in labor and have been admitted into the hospital or birth center, your health care provider may:  Review your pregnancy history and any concerns you have.  Insert an IV tube into one of your veins. This is used to give you fluids and medicines.  Check your blood pressure, pulse, temperature, and heart rate (vital signs).  Check whether your bag of water (amniotic sac) has broken (ruptured).  Talk with you about your birth plan and discuss pain control options.  Monitoring Your health care provider may monitor your contractions (uterine monitoring) and your baby's heart rate (fetal monitoring). You may need to be monitored:  Often, but not continuously (intermittently).  All the time or for long periods at a time (continuously). Continuous monitoring may be needed if: ? You are taking certain medicines, such as medicine to relieve pain or make your contractions stronger. ? You have pregnancy or labor complications.  Monitoring may be done by:  Placing a special stethoscope or a handheld monitoring device on your abdomen to check your   baby's heartbeat, and feeling your abdomen for contractions. This method of monitoring does not continuously record your baby's heartbeat or your contractions.  Placing monitors on your abdomen (external monitors) to record your baby's heartbeat and the frequency and length of contractions. You may not have to wear external monitors all the time.  Placing monitors inside of your uterus (internal monitors) to  record your baby's heartbeat and the frequency, length, and strength of your contractions. ? Your health care provider may use internal monitors if he or she needs more information about the strength of your contractions or your baby's heart rate. ? Internal monitors are put in place by passing a thin, flexible wire through your vagina and into your uterus. Depending on the type of monitor, it may remain in your uterus or on your baby's head until birth. ? Your health care provider will discuss the benefits and risks of internal monitoring with you and will ask for your permission before inserting the monitors.  Telemetry. This is a type of continuous monitoring that can be done with external or internal monitors. Instead of having to stay in bed, you are able to move around during telemetry. Ask your health care provider if telemetry is an option for you.  Physical exam Your health care provider may perform a physical exam. This may include:  Checking whether your baby is positioned: ? With the head toward your vagina (head-down). This is most common. ? With the head toward the top of your uterus (head-up or breech). If your baby is in a breech position, your health care provider may try to turn your baby to a head-down position so you can deliver vaginally. If it does not seem that your baby can be born vaginally, your provider may recommend surgery to deliver your baby. In rare cases, you may be able to deliver vaginally if your baby is head-up (breech delivery). ? Lying sideways (transverse). Babies that are lying sideways cannot be delivered vaginally.  Checking your cervix to determine: ? Whether it is thinning out (effacing). ? Whether it is opening up (dilating). ? How low your baby has moved into your birth canal.  What are the three stages of labor and delivery?  Normal labor and delivery is divided into the following three stages: Stage 1  Stage 1 is the longest stage of labor,  and it can last for hours or days. Stage 1 includes: ? Early labor. This is when contractions may be irregular, or regular and mild. Generally, early labor contractions are more than 10 minutes apart. ? Active labor. This is when contractions get longer, more regular, more frequent, and more intense. ? The transition phase. This is when contractions happen very close together, are very intense, and may last longer than during any other part of labor.  Contractions generally feel mild, infrequent, and irregular at first. They get stronger, more frequent (about every 2-3 minutes), and more regular as you progress from early labor through active labor and transition.  Many women progress through stage 1 naturally, but you may need help to continue making progress. If this happens, your health care provider may talk with you about: ? Rupturing your amniotic sac if it has not ruptured yet. ? Giving you medicine to help make your contractions stronger and more frequent.  Stage 1 ends when your cervix is completely dilated to 4 inches (10 cm) and completely effaced. This happens at the end of the transition phase. Stage 2  Once your cervix   is completely effaced and dilated to 4 inches (10 cm), you may start to feel an urge to push. It is common for the body to naturally take a rest before feeling the urge to push, especially if you received an epidural or certain other pain medicines. This rest period may last for up to 1-2 hours, depending on your unique labor experience.  During stage 2, contractions are generally less painful, because pushing helps relieve contraction pain. Instead of contraction pain, you may feel stretching and burning pain, especially when the widest part of your baby's head passes through the vaginal opening (crowning).  Your health care provider will closely monitor your pushing progress and your baby's progress through the vagina during stage 2.  Your health care provider may  massage the area of skin between your vaginal opening and anus (perineum) or apply warm compresses to your perineum. This helps it stretch as the baby's head starts to crown, which can help prevent perineal tearing. ? In some cases, an incision may be made in your perineum (episiotomy) to allow the baby to pass through the vaginal opening. An episiotomy helps to make the opening of the vagina larger to allow more room for the baby to fit through.  It is very important to breathe and focus so your health care provider can control the delivery of your baby's head. Your health care provider may have you decrease the intensity of your pushing, to help prevent perineal tearing.  After delivery of your baby's head, the shoulders and the rest of the body generally deliver very quickly and without difficulty.  Once your baby is delivered, the umbilical cord may be cut right away, or this may be delayed for 1-2 minutes, depending on your baby's health. This may vary among health care providers, hospitals, and birth centers.  If you and your baby are healthy enough, your baby may be placed on your chest or abdomen to help maintain the baby's temperature and to help you bond with each other. Some mothers and babies start breastfeeding at this time. Your health care team will dry your baby and help keep your baby warm during this time.  Your baby may need immediate care if he or she: ? Showed signs of distress during labor. ? Has a medical condition. ? Was born too early (prematurely). ? Had a bowel movement before birth (meconium). ? Shows signs of difficulty transitioning from being inside the uterus to being outside of the uterus. If you are planning to breastfeed, your health care team will help you begin a feeding. Stage 3  The third stage of labor starts immediately after the birth of your baby and ends after you deliver the placenta. The placenta is an organ that develops during pregnancy to provide  oxygen and nutrients to your baby in the womb.  Delivering the placenta may require some pushing, and you may have mild contractions. Breastfeeding can stimulate contractions to help you deliver the placenta.  After the placenta is delivered, your uterus should tighten (contract) and become firm. This helps to stop bleeding in your uterus. To help your uterus contract and to control bleeding, your health care provider may: ? Give you medicine by injection, through an IV tube, by mouth, or through your rectum (rectally). ? Massage your abdomen or perform a vaginal exam to remove any blood clots that are left in your uterus. ? Empty your bladder by placing a thin, flexible tube (catheter) into your bladder. ? Encourage you to   breastfeed your baby. After labor is over, you and your baby will be monitored closely to ensure that you are both healthy until you are ready to go home. Your health care team will teach you how to care for yourself and your baby. This information is not intended to replace advice given to you by your health care provider. Make sure you discuss any questions you have with your health care provider. Document Released: 03/23/2008 Document Revised: 01/02/2016 Document Reviewed: 06/29/2015 [employer] Interactive Patient Education  2018 [employer]  

## 2017-08-30 ENCOUNTER — Other Ambulatory Visit: Payer: Self-pay

## 2017-11-20 ENCOUNTER — Encounter (HOSPITAL_COMMUNITY): Payer: Self-pay

## 2017-12-01 ENCOUNTER — Ambulatory Visit: Payer: Self-pay | Admitting: Obstetrics and Gynecology

## 2018-02-06 ENCOUNTER — Encounter: Payer: Self-pay | Admitting: Advanced Practice Midwife

## 2018-02-06 ENCOUNTER — Ambulatory Visit (INDEPENDENT_AMBULATORY_CARE_PROVIDER_SITE_OTHER): Payer: BLUE CROSS/BLUE SHIELD | Admitting: Advanced Practice Midwife

## 2018-02-06 ENCOUNTER — Other Ambulatory Visit (HOSPITAL_COMMUNITY)
Admission: RE | Admit: 2018-02-06 | Discharge: 2018-02-06 | Disposition: A | Discharge status: Home / Self Care | Payer: BLUE CROSS/BLUE SHIELD | Source: Ambulatory Visit | Attending: Advanced Practice Midwife | Admitting: Advanced Practice Midwife

## 2018-02-06 VITALS — BP 109/64 | HR 92 | Ht 61.0 in | Wt 170.4 lb

## 2018-02-06 DIAGNOSIS — N898 Other specified noninflammatory disorders of vagina: Secondary | ICD-10-CM | POA: Insufficient documentation

## 2018-02-06 MED ORDER — HYDROCORTISONE 2.5 % EX CREA: TOPICAL_CREAM | Freq: Two times a day (BID) | CUTANEOUS | 2 refills | Status: AC

## 2018-02-06 MED ORDER — METRONIDAZOLE 500 MG PO TABS: 500.0000 mg | ORAL_TABLET | Freq: Two times a day (BID) | ORAL | 0 refills | Status: AC

## 2018-02-06 NOTE — Progress Notes (Signed)
  Subjective:     Patient ID: Karla Davies, female   DOB: 03/24/1991, 27 y.o.   MRN: [id number]  Vaginal Discharge  The patient's primary symptoms include vaginal discharge. This is a new problem. The current episode started in the past 7 days. The problem occurs constantly. The problem has been unchanged. The patient is experiencing no pain. She is not pregnant. The vaginal discharge was malodorous, thick and white. There has been no bleeding. Nothing aggravates the symptoms. She has tried nothing for the symptoms. She is sexually active.     Review of Systems  Genitourinary: Positive for vaginal discharge.  All other systems reviewed and are negative.      Objective:   Physical Exam  Constitutional: She is oriented to person, place, and time. She appears well-developed and well-nourished. No distress.  HENT:  Head: Normocephalic.  Cardiovascular: Normal rate.  Pulmonary/Chest: Effort normal.  Abdominal: Soft.  Neurological: She is alert and oriented to person, place, and time.  Skin: Skin is warm and dry.  Psychiatric: She has a normal mood and affect.  Nursing note and vitals reviewed.      Assessment:     1. Vaginal discharge       Plan:     Flagyl 500mg  BID x 7 days Will send my chart message to patient if test results indicate need for different treatment.  Hydrocortisone 2.5% cream for eczema.

## 2018-02-06 NOTE — Addendum Note (Signed)
Addended by: Jill SideAY, Stephene Alegria L on: 02/06/2018 07:36 PM   Modules accepted: Orders

## 2018-02-06 NOTE — Progress Notes (Signed)
Pt states she started noticing vaginal odor 2 weeks ago.  She denies discharge. Thinks she may have BV.

## 2018-02-08 LAB — CERVICOVAGINAL ANCILLARY ONLY
Bacterial vaginitis: POSITIVE — AB
Candida vaginitis: NEGATIVE
Chlamydia: NEGATIVE
NEISSERIA GONORRHEA: NEGATIVE
Trichomonas: NEGATIVE

## 2018-04-05 ENCOUNTER — Ambulatory Visit: Payer: Self-pay | Admitting: Student
